# Patient Record
Sex: Male | Born: 1960 | Race: White | Hispanic: No | Marital: Single | Smoking: Former smoker
Health system: Southern US, Community
[De-identification: ages and names within clinical notes are randomized; demographics above are authoritative.]

## PROBLEM LIST (undated history)

## (undated) DIAGNOSIS — K5792 Diverticulitis of intestine, part unspecified, without perforation or abscess without bleeding: Secondary | ICD-10-CM

## (undated) DIAGNOSIS — E7800 Pure hypercholesterolemia, unspecified: Secondary | ICD-10-CM

## (undated) DIAGNOSIS — I10 Essential (primary) hypertension: Secondary | ICD-10-CM

## (undated) DIAGNOSIS — G629 Polyneuropathy, unspecified: Secondary | ICD-10-CM

## (undated) DIAGNOSIS — M1990 Unspecified osteoarthritis, unspecified site: Secondary | ICD-10-CM

## (undated) HISTORY — DX: Essential (primary) hypertension: I10

## (undated) HISTORY — DX: Pure hypercholesterolemia, unspecified: E78.00

## (undated) HISTORY — DX: Polyneuropathy, unspecified: G62.9

## (undated) HISTORY — PX: NASAL SINUS SURGERY: SHX719

## (undated) HISTORY — PX: GALLBLADDER SURGERY: SHX652

## (undated) HISTORY — PX: TONSILLECTOMY AND ADENOIDECTOMY: SHX28

## (undated) HISTORY — DX: Diverticulitis of intestine, part unspecified, without perforation or abscess without bleeding: K57.92

## (undated) HISTORY — PX: INGUINAL HERNIA REPAIR: SHX194

---

## 1998-12-22 ENCOUNTER — Encounter: Admission: RE | Admit: 1998-12-22 | Discharge: 1999-03-22 | Payer: Self-pay | Admitting: Family Medicine

## 2010-04-27 ENCOUNTER — Telehealth: Payer: Self-pay | Admitting: Gastroenterology

## 2011-04-04 NOTE — Telephone Encounter (Signed)
Summary: Schedule Colonoscopy  Phone Note Outgoing Call Call back at Home Phone [phone]   Call placed by: Harlow Mares CMA Duncan Dull),  April 27, 2010 4:23 PM Call placed to: Patient Summary of Call: patients number will not allow me to leave a message but i will try to call back at a later date. He is due for a colonoscopy.  Initial call taken by: Harlow Mares CMA Duncan Dull),  April 27, 2010 4:26 PM  Follow-up for Phone Call        patinet number will not allow me to leave a message...we will mail him a letter to let him know he is due for a colonoscopy.  Follow-up by: Harlow Mares CMA Duncan Dull),  May 06, 2010 10:41 AM

## 2012-02-22 ENCOUNTER — Encounter: Payer: Self-pay | Admitting: Gastroenterology

## 2014-07-07 ENCOUNTER — Encounter: Payer: Self-pay | Admitting: Neurology

## 2014-07-07 ENCOUNTER — Encounter (INDEPENDENT_AMBULATORY_CARE_PROVIDER_SITE_OTHER): Payer: Self-pay

## 2014-07-07 ENCOUNTER — Ambulatory Visit (INDEPENDENT_AMBULATORY_CARE_PROVIDER_SITE_OTHER): Payer: BC Managed Care – HMO | Admitting: Neurology

## 2014-07-07 ENCOUNTER — Encounter (INDEPENDENT_AMBULATORY_CARE_PROVIDER_SITE_OTHER): Payer: BC Managed Care – HMO

## 2014-07-07 VITALS — BP 145/81 | HR 60 | Ht 72.0 in | Wt 240.0 lb

## 2014-07-07 DIAGNOSIS — E7800 Pure hypercholesterolemia, unspecified: Secondary | ICD-10-CM | POA: Insufficient documentation

## 2014-07-07 DIAGNOSIS — R209 Unspecified disturbances of skin sensation: Secondary | ICD-10-CM

## 2014-07-07 DIAGNOSIS — R202 Paresthesia of skin: Secondary | ICD-10-CM

## 2014-07-07 DIAGNOSIS — G629 Polyneuropathy, unspecified: Secondary | ICD-10-CM

## 2014-07-07 DIAGNOSIS — G589 Mononeuropathy, unspecified: Secondary | ICD-10-CM

## 2014-07-07 DIAGNOSIS — Z0289 Encounter for other administrative examinations: Secondary | ICD-10-CM

## 2014-07-07 NOTE — Progress Notes (Signed)
See office visit Sep 28th 2015

## 2014-07-07 NOTE — Procedures (Signed)
   NCS (NERVE CONDUCTION STUDY) WITH EMG (ELECTROMYOGRAPHY) REPORT   STUDY DATE: September 28th 2015 PATIENT NAME: Jack Sims DOB: 16-Jun-1961 MRN: [id number]    TECHNOLOGIST: Gearldine Shown ELECTROMYOGRAPHER: Levert Feinstein M.D.  CLINICAL INFORMATION:  53 years old gentleman, presenting with bilateral hands, and feet paresthesia for few months, he had a past medical history of glucose intolerance, chronic low back pain.  FINDINGS: NERVE CONDUCTION STUDY: Bilateral peroneal sensory responses were absent. Bilateral peroneal, tibial motor responses showed mildly to moderately decreased C. map amplitude, was normal conduction velocity, distal latency. Bilateral tibial H. reflexes were absent.  Bilateral median, ulnar sensory and motor responses were normal.  NEEDLE ELECTROMYOGRAPHY:  Selected needle examination was performed at bilateral lower extremity muscles, right lumbosacral paraspinal muscles.  Needle examination of bilateral tibialis anterior, tibialis posterior, peroneal longus, medial gastrocnemius, vastus lateralis was normal.  Needle examination of left abductor pollicis longus was normal.  There was no spontaneous activity at right lumbosacral paraspinal muscles, right L4-5 S1.  IMPRESSION:   This is an abnormal study. There is electrodiagnostic evidence of length dependent mild axonal peripheral neuropathy. There was no evidence of right lumbosacral radiculopathy.   INTERPRETING PHYSICIAN:   Levert Feinstein M.D. Ph.D. [facility] [address], [address] [place], [place] [address] [phone]

## 2014-07-07 NOTE — Progress Notes (Signed)
PATIENT: Jack Sims DOB: 1961-07-05  HISTORICAL  Jack Sims is a 53 years old right-handed male, referred by his primary care physician Dr. Suzy Bouchard for evaluation of bilateral hands, and feet paresthesia next  He had a past meniscal history of hyperlipidemia, glucose intolerance, chronic low back pain  Since July 2015, he began to notice bilateral hands intermittent itching, involving different spots, sometimes to the point that"I want to claw my hand off", a few weeks ago, he also developed similar sensation involving both feet, bottom, and top of his feet, intermittent, involving different spots, intense itching sensation.  He did not notice any allergy treaters, such as change of the lotion, detergents,  He denies significant weakness, no gait difficulty, chronic intermittent low back pain, no incontinence, mild neck pain, no shooting pain.  Laboratory showed glucose 133, nonfasting, normal TSH, otherwise normal CBC CMP.  REVIEW OF SYSTEMS: Full 14 system review of systems performed and notable only for snoring ALLERGIES: Allergies  Allergen Reactions  . Sudafed [Pseudoephedrine Hcl]     HOME MEDICATIONS: Current Outpatient Prescriptions on File Prior to Visit  Medication Sig Dispense Refill  . aspirin 81 MG tablet Take 81 mg by mouth daily.       No current facility-administered medications on file prior to visit.    PAST MEDICAL HISTORY: Past Medical History  Diagnosis Date  . Neuropathy   . High cholesterol     PAST SURGICAL HISTORY: Past Surgical History  Procedure Laterality Date  . Tonsillectomy and adenoidectomy    . Gallbladder surgery    . Nasal sinus surgery    . Hernia repair      FAMILY HISTORY: Family History  Problem Relation Age of Onset  . Cancer Mother   . Heart Problems Mother     SOCIAL HISTORY:  History   Social History  . Marital Status: Unknown    Spouse Name: N/A    Number of Children: 1  . Years of Education: 13    Occupational History  .      [employer]   Social History Main Topics  . Smoking status: Current Every Day Smoker  . Smokeless tobacco: Never Used  . Alcohol Use: 0.6 oz/week    1 Cans of beer per week     Comment: Rare  . Drug Use: No  . Sexual Activity: Not on file   Other Topics Concern  . Not on file   Social History Narrative   Patient Lives at home with his girlfriend. Patient works full time for [employer].    Education some college.   Right handed.   Caffeine 3-4 Pots of coffee daily.     PHYSICAL EXAM   There were no vitals filed for this visit.  Not recorded    There is no height or weight on file to calculate BMI.   Generalized: In no acute distress  Neck: Supple, no carotid bruits   Cardiac: Regular rate rhythm  Pulmonary: Clear to auscultation bilaterally  Musculoskeletal: No deformity  Neurological examination  Mentation: Alert oriented to time, place, history taking, and causual conversation  Cranial nerve II-XII: Pupils were equal round reactive to light. Extraocular movements were full.  Visual field were full on confrontational test. Bilateral fundi were sharp.  Facial sensation and strength were normal. Hearing was intact to finger rubbing bilaterally. Uvula tongue midline.  Head turning and shoulder shrug and were normal and symmetric.Tongue protrusion into cheek strength was normal.  Motor:  Normal tone, bulk and strength.  Sensory: Length dependent decreased fine touch, pinprick, ankle level, absent vibratory sensation at toes, and proprioception at toes.  Coordination: Normal finger to nose, heel-to-shin bilaterally there was no truncal ataxia  Gait: Rising up from seated position without assistance, normal stance, without trunk ataxia, moderate stride, good arm swing, smooth turning, able to perform tiptoe, and heel walking without difficulty.   Romberg signs: Negative  Deep tendon reflexes: Brachioradialis 2/2, biceps 2/2, triceps 2/2,  patellar 2/2, Achilles 2/2, plantar responses were flexor bilaterally.   DIAGNOSTIC DATA (LABS, IMAGING, TESTING) - I reviewed patient records, labs, notes, testing and imaging myself where available.   ASSESSMENT AND PLAN  Jack Sims is a 53 y.o. male complains of bilateral hands, and feet paresthesia, history of glucose intolerance, mild length dependent sensory changes.  1. He does have evidence of peripheral neuropathy, most likely due to his glucose intolerance, EMG nerve conduction study 2. However his current complains of intermittent, bilateral hand and feet intense itching, suggestive of other potential etiology, such as  allergy, dermatology pathology. 3. I have suggested Benadryl as needed giving him prescription of gabapentin as needed 4. Return to clinic in 3-4 weeks, if he continued to be symptomatic, may consider further laboratory evaluations,    Levert Feinstein, M.D. Ph.D.  [facility] [address] [place], [place] [address] [phone]

## 2014-08-04 ENCOUNTER — Ambulatory Visit (INDEPENDENT_AMBULATORY_CARE_PROVIDER_SITE_OTHER): Payer: BC Managed Care – HMO | Admitting: Neurology

## 2014-08-04 ENCOUNTER — Encounter: Payer: Self-pay | Admitting: Neurology

## 2014-08-04 VITALS — BP 149/87 | HR 54 | Ht 72.0 in | Wt 240.0 lb

## 2014-08-04 DIAGNOSIS — E7800 Pure hypercholesterolemia, unspecified: Secondary | ICD-10-CM

## 2014-08-04 DIAGNOSIS — R202 Paresthesia of skin: Secondary | ICD-10-CM

## 2014-08-04 DIAGNOSIS — G629 Polyneuropathy, unspecified: Secondary | ICD-10-CM

## 2014-08-04 NOTE — Progress Notes (Signed)
See office visit Sep 28th 2015    PATIENT: Jack LeashMarc S Sims DOB: 12/10/1960  HISTORICAL  Jack Sims is a 53 years old right-handed male, referred by his primary care physician Dr. Suzy BouchardJames Milam for evaluation of bilateral hands, and feet paresthesia next  He had a past meniscal history of hyperlipidemia, glucose intolerance, chronic low back pain  Since July 2015, he began to notice bilateral hands intermittent itching, involving different spots, sometimes to the point that"I want to claw my hand off", a few weeks ago, he also developed similar sensation involving both feet, bottom, and top of his feet, intermittent, involving different spots, intense itching sensation.  He did not notice any allergy treaters, such as change of the lotion, detergents,  He denies significant weakness, no gait difficulty, chronic intermittent low back pain, no incontinence, mild neck pain, no shooting pain.  Laboratory showed glucose 133, nonfasting, normal TSH, otherwise normal CBC CMP.  UPDATE Oct 26th 2015:  He has chronic low back pain, he continues to complain of bilateral hand and feet paresthesia, but has improved with daily claritin and prn benadryl. No gait difficulty   REVIEW OF SYSTEMS: Full 14 system review of systems performed and notable only for snoring ALLERGIES: Allergies  Allergen Reactions  . Sudafed [Pseudoephedrine Hcl]     HOME MEDICATIONS: Current Outpatient Prescriptions on File Prior to Visit  Medication Sig Dispense Refill  . aspirin 81 MG tablet Take 81 mg by mouth daily.       No current facility-administered medications on file prior to visit.    PAST MEDICAL HISTORY: Past Medical History  Diagnosis Date  . Neuropathy   . High cholesterol     PAST SURGICAL HISTORY: Past Surgical History  Procedure Laterality Date  . Tonsillectomy and adenoidectomy    . Gallbladder surgery    . Nasal sinus surgery    . Hernia repair      FAMILY HISTORY: Family History    Problem Relation Age of Onset  . Cancer Mother   . Heart Problems Mother     SOCIAL HISTORY:  History   Social History  . Marital Status: Unknown    Spouse Name: N/A    Number of Children: 1  . Years of Education: 13   Occupational History  .      [employer]   Social History Main Topics  . Smoking status: Current Every Day Smoker  . Smokeless tobacco: Never Used  . Alcohol Use: 0.6 oz/week    1 Cans of beer per week     Comment: Rare  . Drug Use: No  . Sexual Activity: Not on file   Other Topics Concern  . Not on file   Social History Narrative   Patient Lives at home with his girlfriend. Patient works full time for [employer]    Education some college.   Right handed.   Caffeine 3-4 Pots of coffee daily.     PHYSICAL EXAM   Filed Vitals:   08/04/14 0816  BP: 149/87  Pulse: 54  Height: 6' (1.829 m)  Weight: 240 lb (108.863 kg)    Not recorded    Body mass index is 32.54 kg/(m^2).   Generalized: In no acute distress  Neck: Supple, no carotid bruits   Cardiac: Regular rate rhythm  Pulmonary: Clear to auscultation bilaterally  Musculoskeletal: No deformity  Neurological examination  Mentation: Alert oriented to time, place, history taking, and causual conversation  Cranial nerve II-XII: Pupils were equal round  reactive to light. Extraocular movements were full.  Visual field were full on confrontational test. Bilateral fundi were sharp.  Facial sensation and strength were normal. Hearing was intact to finger rubbing bilaterally. Uvula tongue midline.  Head turning and shoulder shrug and were normal and symmetric.Tongue protrusion into cheek strength was normal.  Motor: Normal tone, bulk and strength.  Sensory: Length dependent decreased fine touch, pinprick, ankle level, absent vibratory sensation at toes, and proprioception at toes.  Coordination: Normal finger to nose, heel-to-shin bilaterally there was no truncal ataxia  Gait: Rising up from  seated position without assistance, normal stance, without trunk ataxia, moderate stride, good arm swing, smooth turning, able to perform tiptoe, and heel walking without difficulty.   Romberg signs: Negative  Deep tendon reflexes: Brachioradialis 2/2, biceps 2/2, triceps 2/2, patellar 2/2, Achilles 1/1,  plantar responses were flexor bilaterally.   DIAGNOSTIC DATA (LABS, IMAGING, TESTING) - I reviewed patient records, labs, notes, testing and imaging myself where available.   ASSESSMENT AND PLAN  Jack Sims is a 53 y.o. male complains of bilateral hands, and feet paresthesia, history of glucose intolerance, mild length dependent sensory changes.  1. Mild axonal peripheral neuropathy, most likely due to his glucose intolerance,  2. However his current complains of intermittent, bilateral hand and feet intense itching, suggestive of other potential etiology, such as dermatological etiology. 3. He does not want further evaluation at this point,  4. RTC prn  Levert FeinsteinYijun Wahid Holley, M.D. Ph.D.  [facility] [address], [address] [place], [place] [id number] [phone]

## 2014-09-09 ENCOUNTER — Telehealth: Payer: Self-pay | Admitting: Gastroenterology

## 2014-09-09 NOTE — Telephone Encounter (Signed)
Colonoscopy done 2005-records in chart Spoke with the patient. Reports his Thanksgiving was the worst due to the discomfort he feels. He was also being referred by his primary for rectal bleeding. Appointment scheduled.

## 2014-09-10 ENCOUNTER — Ambulatory Visit (INDEPENDENT_AMBULATORY_CARE_PROVIDER_SITE_OTHER): Payer: BC Managed Care – PPO | Admitting: Gastroenterology

## 2014-09-10 ENCOUNTER — Encounter: Payer: Self-pay | Admitting: Gastroenterology

## 2014-09-10 VITALS — BP 142/72 | HR 60 | Ht 71.5 in | Wt 244.0 lb

## 2014-09-10 DIAGNOSIS — K5732 Diverticulitis of large intestine without perforation or abscess without bleeding: Secondary | ICD-10-CM

## 2014-09-10 DIAGNOSIS — R1032 Left lower quadrant pain: Secondary | ICD-10-CM

## 2014-09-10 MED ORDER — METRONIDAZOLE 500 MG PO TABS: 500.0000 mg | ORAL_TABLET | Freq: Three times a day (TID) | ORAL | Status: DC

## 2014-09-10 MED ORDER — CIPROFLOXACIN HCL 500 MG PO TABS: 500.0000 mg | ORAL_TABLET | Freq: Two times a day (BID) | ORAL | Status: DC

## 2014-09-10 NOTE — Progress Notes (Signed)
09/10/2014 Jack Sims [id number] 09/30/1961   HISTORY OF PRESENT ILLNESS:  This is a 53 year old male who is known to Dr. Arlyce DiceKaplan previously in February 2005 at which time he had a colonoscopy performed that showed hyperplastic colon polyps and diverticulosis. Since then he has been following with [place]. Aleene [place]hour in [place].  He is returning here for care after Dr. Vangie BickerSpainhour's retirement.  We received some records from their office.  His last colonoscopy was 11/2009 at which time he was found to have diverticulosis and internal hemorrhoids.  He is here today with complaints of LLQ abdominal pain that he is convinced is from diverticulitis.  He says that he's had issues with diverticulitis for over 10 years and has been treated multiple times with antibiotics.  No radiographic imaging to confirm episodes, however.  He says that the since June this is his third episode of pain.  Was treated in the summer and then again in November and now pain has returned.  It is in LLQ and makes his whole left side hurt.  No nausea, vomiting, fevers, chills.  No constipation.  Had some occasional red blood on the toilet paper since these "flares" of diverticulitis began this past summer.  He is adamant that he would like to discuss surgery since he has been dealing with this for so long and just wants it fixed.   Past Medical History  Diagnosis Date  . Neuropathy   . High cholesterol   . Hypertension    Past Surgical History  Procedure Laterality Date  . Tonsillectomy and adenoidectomy    . Gallbladder surgery    . Nasal sinus surgery    . Hernia repair      reports that he has been smoking Cigarettes.  He has a 35 pack-year smoking history. He has never used smokeless tobacco. He reports that he drinks about 0.6 oz of alcohol per week. He reports that he does not use illicit drugs. family history includes Diabetes in his maternal grandmother and mother; Heart Problems in his mother; Pancreatic  cancer in his mother. There is no history of Colon cancer, Colon polyps, Kidney disease, or Esophageal cancer. Allergies  Allergen Reactions  . Sudafed [Pseudoephedrine Hcl]       Outpatient Encounter Prescriptions as of 09/10/2014  Medication Sig  . aspirin 81 MG tablet Take 81 mg by mouth daily.  Marland Kitchen. loratadine (CLARITIN) 10 MG tablet Take 10 mg by mouth daily.  . ciprofloxacin (CIPRO) 500 MG tablet Take 1 tablet (500 mg total) by mouth 2 (two) times daily.  . metroNIDAZOLE (FLAGYL) 500 MG tablet Take 1 tablet (500 mg total) by mouth 3 (three) times daily.     REVIEW OF SYSTEMS  : All other systems reviewed and negative except where noted in the History of Present Illness.   PHYSICAL EXAM: BP 142/72 mmHg  Pulse 60  Ht 5' 11.5" (1.816 m)  Wt 244 lb (110.678 kg)  BMI 33.56 kg/m2 General: Well developed white male in no acute distress Head: Normocephalic and atraumatic Eyes:  Sclerae anicteric, conjunctiva pink. Ears: Normal auditory acuity Lungs: Clear throughout to auscultation Heart: Regular rate and rhythm Abdomen: Soft, non-distended.  Normal bowel sounds.  LLQ TTP without R/R/G. Musculoskeletal: Symmetrical with no gross deformities  Skin: No lesions on visible extremities Extremities: No edema  Neurological: Alert oriented x 4, grossly non-focal Psychological:  Alert and cooperative. Normal mood and affect  ASSESSMENT AND PLAN: -53 year old male with recurrent episodes  of LLQ abdominal pain and history of diverticulosis.  Treated recurrently for diverticulitis over the years, but we do not have any imaging to document these recurrent episodes.  He is adamant that he wants to discuss surgery because he is "tired of dealing with this" after 10+ years.  I have suggested that we obtain a CT scan to assess and radiographically document diverticulitis and also rule out other issues.  Will treat with cipro 500 mg BID and flagyl 500 mg TID for 10 days.  Will refer to [facility] for  consultation regarding possible segmental/sigmoid resection.

## 2014-09-10 NOTE — Patient Instructions (Addendum)
We have sent the following medications to your pharmacy for you to pick up at your convenience: Cipro  Flagyl  You have been scheduled for an appointment with Dr. Johney Maine at [facility]. Your appointment is on 09-23-2014 at 10:15 am. Please arrive at 9:45 am for registration. Make certain to bring a list of current medications, including any over the counter medications or vitamins. Also bring your co-pay if you have one as well as your insurance cards. [facility] is located at [address]. Should you need to reschedule your appointment, please contact them at [phone]. _______________________________________________________________________________________________________________________________________________________________________________________________ You have been scheduled for a CT scan of the abdomen and pelvis at [facility] ([address]---this is in the same building as Press [facility]).   You are scheduled on 09-15-2014 at 11:30 AM. You should arrive 15 minutes prior to your appointment time for registration. Please follow the written instructions below on the day of your exam:  WARNING: IF YOU ARE ALLERGIC TO IODINE/X-RAY DYE, PLEASE NOTIFY RADIOLOGY IMMEDIATELY AT [phone]! YOU WILL BE GIVEN A 13 HOUR PREMEDICATION PREP.  1) Do not eat or drink anything after 7:30 am (4 hours prior to your test) 2) You have been given 2 bottles of oral contrast to drink. The solution may taste better if refrigerated, but do NOT add ice or any other liquid to this solution. Shake well before drinking.   Drink 1 bottle of contrast @ 9:30 am (2 hours prior to your exam) Drink 1 bottle of contrast @ 10:30 am (1 hour prior to your exam)  You may take any medications as prescribed with a small amount of water except for the following: Metformin, Glucophage, Glucovance, Avandamet, Riomet, Fortamet, Actoplus Met, Janumet, Glumetza or  Metaglip. The above medications must be held the day of the exam AND 48 hours after the exam.  The purpose of you drinking the oral contrast is to aid in the visualization of your intestinal tract. The contrast solution may cause some diarrhea. Before your exam is started, you will be given a small amount of fluid to drink. Depending on your individual set of symptoms, you may also receive an intravenous injection of x-ray contrast/dye. Plan on being at [facility] for 30 minutes or long, depending on the type of exam you are having performed.  This test typically takes 30-45 minutes to complete.  If you have any questions regarding your exam or if you need to reschedule, you may call the CT department at [phone] between the hours of 8:00 am and 5:00 pm, Monday-Friday.  ________________________________________________________________________

## 2014-09-11 NOTE — Progress Notes (Signed)
Reviewed and agree with management. Yesly Gerety D. Neysha Criado, M.D., FACG  

## 2014-09-15 ENCOUNTER — Ambulatory Visit (INDEPENDENT_AMBULATORY_CARE_PROVIDER_SITE_OTHER)
Admission: RE | Admit: 2014-09-15 | Discharge: 2014-09-15 | Disposition: A | Discharge status: Home / Self Care | Payer: BC Managed Care – PPO | Source: Ambulatory Visit | Attending: Gastroenterology | Admitting: Gastroenterology

## 2014-09-15 DIAGNOSIS — K5732 Diverticulitis of large intestine without perforation or abscess without bleeding: Secondary | ICD-10-CM

## 2014-09-15 DIAGNOSIS — R1032 Left lower quadrant pain: Secondary | ICD-10-CM

## 2014-09-15 MED ORDER — IOHEXOL 300 MG/ML  SOLN
100.0000 mL | Freq: Once | INTRAMUSCULAR | Status: AC | PRN
  Administered 2014-09-15: 100 mL via INTRAVENOUS

## 2014-09-23 ENCOUNTER — Other Ambulatory Visit (INDEPENDENT_AMBULATORY_CARE_PROVIDER_SITE_OTHER): Payer: Self-pay | Admitting: Surgery

## 2014-09-23 NOTE — H&P (Signed)
Lupita Leash 09/23/2014 10:03 AM Location: [facility] Patient #: [id number] DOB: 1961-01-17 Divorced / Language: Shon Hough / Race: Undefined Male History of Present Illness [name] MD; 09/23/2014 12:47 PM) Patient words: recurrent diverticulitis.  The patient is a 53 year old male who presents with diverticulitis. Patient sent to me by gastroenterology for concern of recurrent diverticulitis. Followed by [name] and Dr. [name] Patient claims to have had episodes of left flank and lower quadrant pain for the past 15 years. Told it was diverticulitis about 10 years ago. He's had repeated attacks. Usually switches to a liquid diet and takes laxatives to help ride it out. He's needed antibiotics at least 3 times. He's had more frequent and intense attacks this year. 3 attacks in the last 6 months. His gastroenterologist in [place], [place] retired. Switched to gastroenterology in [place]. Had a more intense attack. Started on antibiotics. CT scan showed some thickening of the sigmoid colon. A little atypical. Patient due to get flexible sigmoidoscopy to rule out mass or tumor. Had a normal colonoscopy in 2011 by his gastroenterologist up in [place] before that gastroenterologist retired. Patient had open right inguinal hernia repair and cholecystectomy done laparoscopically up in [place]. Patient normally has 1-2 bowel movements a day. He does get some cramping and fullness in his left upper abdomen. He feels like he gets backed up and constipated. He does smoke a pack a day. Denies any major infections. He can walk a few miles without much difficulty his lungs he goes at a slow and even pace. He is frustrated and wishes to have something more aggressive done to stop these episodes of diverticulitis. Wished to see a Careers adviser. This was set up by his gastroenterologist. He has mild chronic left flank pain now. Off antibiotics. He has noted some  blood in his stool since June. Other Problems Kerrie Buffalo, CMA; 09/23/2014 10:03 AM) Arthritis Back Pain Cholelithiasis Diverticulosis Hemorrhoids High blood pressure Hypercholesterolemia Inguinal Hernia  Past Surgical History Kerrie Buffalo, CMA; 09/23/2014 10:03 AM) Colon Polyp Removal - Colonoscopy Gallbladder Surgery - Laparoscopic Open Inguinal Hernia Surgery Right. Tonsillectomy  Diagnostic Studies History Kerrie Buffalo, CMA; 09/23/2014 10:03 AM) Colonoscopy 1-5 years ago  Allergies Kerrie Buffalo, CMA; 09/23/2014 10:04 AM) Sudafed *NASAL AGENTS - SYSTEMIC AND TOPICAL*  Medication History Kerrie Buffalo, CMA; 09/23/2014 10:05 AM) Ciprofloxacin HCl (500MG  Tablet, Oral) Active. Enalapril-Hydrochlorothiazide (5-12.5MG  Tablet, Oral) Active. MetroNIDAZOLE (250MG  Tablet, Oral) Active.  Social History Kerrie Buffalo, CMA; 09/23/2014 10:03 AM) Alcohol use Occasional alcohol use. Caffeine use Coffee, Tea. No drug use Tobacco use Current every day smoker.  Family History Kerrie Buffalo, CMA; 09/23/2014 10:03 AM) Alcohol Abuse Brother, Father. Colon Polyps Father. Diabetes Mellitus Mother. Heart Disease Mother. Heart disease in male family member before age 67 Malignant Neoplasm Of Pancreas Mother. Melanoma Father. Migraine Headache Mother.     Review of Systems Kerrie Buffalo CMA; 09/23/2014 10:03 AM) General Not Present- Appetite Loss, Chills, Fatigue, Fever, Night Sweats, Weight Gain and Weight Loss. Skin Not Present- Change in Wart/Mole, Dryness, Hives, Jaundice, New Lesions, Non-Healing Wounds, Rash and Ulcer. Respiratory Present- Snoring. Not Present- Bloody sputum, Chronic Cough, Difficulty Breathing and Wheezing. Breast Not Present- Breast Mass, Breast Pain, Nipple Discharge and Skin Changes. Cardiovascular Present- Chest Pain. Not Present- Difficulty Breathing Lying Down, Leg Cramps, Palpitations, Rapid Heart  Rate, Shortness of Breath and Swelling of Extremities. Gastrointestinal Present- Abdominal Pain, Bloating, Bloody Stool, Excessive gas and Gets full quickly at meals. Not Present- Change in Bowel Habits,  Chronic diarrhea, Constipation, Difficulty Swallowing, Hemorrhoids, Indigestion, Nausea, Rectal Pain and Vomiting. Male Genitourinary Not Present- Blood in Urine, Change in Urinary Stream, Frequency, Impotence, Nocturia, Painful Urination, Urgency and Urine Leakage.  Vitals Kerrie Buffalo CMA; 09/23/2014 10:05 AM) 09/23/2014 10:05 AM Weight: 240 lb Height: 72in Body Surface Area: 2.35 m Body Mass Index: 32.55 kg/m Temp.: 63F  Pulse: 79 (Regular)  BP: 140/80 (Sitting, Left Arm, Standard)     Physical Exam [name] MD; 09/23/2014 12:47 PM)  General Mental Status-Alert. General Appearance-Not in acute distress, Not Sickly. Orientation-Oriented X3. Hydration-Well hydrated. Voice-Normal.  Integumentary Global Assessment Upon inspection and palpation of skin surfaces of the - Axillae: non-tender, no inflammation or ulceration, no drainage. and Distribution of scalp and body hair is normal. General Characteristics Temperature - normal warmth is noted.  Head and Neck Head-normocephalic, atraumatic with no lesions or palpable masses. Face Global Assessment - atraumatic, no absence of expression. Neck Global Assessment - no abnormal movements, no bruit auscultated on the right, no bruit auscultated on the left, no decreased range of motion, non-tender. Trachea-midline. Thyroid Gland Characteristics - non-tender.  Eye Eyeball - Left-Extraocular movements intact, No Nystagmus. Eyeball - Right-Extraocular movements intact, No Nystagmus. Cornea - Left-No Hazy. Cornea - Right-No Hazy. Sclera/Conjunctiva - Left-No scleral icterus, No Discharge. Sclera/Conjunctiva - Right-No scleral icterus, No Discharge. Pupil - Left-Direct reaction  to light normal. Pupil - Right-Direct reaction to light normal.  ENMT Ears Pinna - Left - no drainage observed, no generalized tenderness observed. Right - no drainage observed, no generalized tenderness observed. Nose and Sinuses External Inspection of the Nose - no destructive lesion observed. Inspection of the nares - Left - quiet respiration. Right - quiet respiration. Mouth and Throat Lips - Upper Lip - no fissures observed, no pallor noted. Lower Lip - no fissures observed, no pallor noted. Nasopharynx - no discharge present. Oral Cavity/Oropharynx - Tongue - no dryness observed. Oral Mucosa - no cyanosis observed. Hypopharynx - no evidence of airway distress observed.  Chest and Lung Exam Inspection Movements - Normal and Symmetrical. Accessory muscles - No use of accessory muscles in breathing. Palpation Palpation of the chest reveals - Non-tender. Auscultation Breath sounds - Normal and Clear.  Cardiovascular Auscultation Rhythm - Regular. Murmurs & Other Heart Sounds - Auscultation of the heart reveals - No Murmurs and No Systolic Clicks.  Abdomen Inspection Inspection of the abdomen reveals - No Visible peristalsis and No Abnormal pulsations. Umbilicus - No Bleeding, No Urine drainage. Palpation/Percussion Palpation and Percussion of the abdomen reveal - Soft, Non Tender, No Rebound tenderness, No Rigidity (guarding) and No Cutaneous hyperesthesia. Note: Mild left flank and lower quadrant tenderness to palpation. Mild left upper quadrant pain. No peritonitis or guarding. Obese but soft.   Male Genitourinary Sexual Maturity Tanner 5 - Adult hair pattern and Adult penile size and shape.  Peripheral Vascular Upper Extremity Inspection - Left - No Cyanotic nailbeds, Not Ischemic. Right - No Cyanotic nailbeds, Not Ischemic.  Neurologic Neurologic evaluation reveals -normal attention span and ability to concentrate, able to name objects and repeat phrases.  Appropriate fund of knowledge , normal sensation and normal coordination. Mental Status Affect - not angry, not paranoid. Cranial Nerves-Normal Bilaterally. Gait-Normal.  Neuropsychiatric Mental status exam performed with findings of-able to articulate well with normal speech/language, rate, volume and coherence, thought content normal with ability to perform basic computations and apply abstract reasoning and no evidence of hallucinations, delusions, obsessions or homicidal/suicidal ideation.  Musculoskeletal Global Assessment Spine, Ribs and  Pelvis - no instability, subluxation or laxity. Right Upper Extremity - no instability, subluxation or laxity.  Lymphatic Head & Neck  General Head & Neck Lymphatics: Bilateral - Description - No Localized lymphadenopathy. Axillary  General Axillary Region: Bilateral - Description - No Localized lymphadenopathy. Femoral & Inguinal  Generalized Femoral & Inguinal Lymphatics: Left - Description - No Localized lymphadenopathy. Right - Description - No Localized lymphadenopathy.    Assessment & Plan [name](Jeaninne Lodico C. 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 Attacks for the past 15 years. Increasing in intensity and frequency.  Given the eccentric thickening and hematochezia for the past few months, I strongly agree with Dr. Marzetta BoardKaplan's suggestion of flexible sigmoidoscopy to rule out cancer or tumor. Hopefully not likely given normal colonoscopy 4 years before.  I think he would benefit from sigmoid resection of the chronic diverticulitis/stricture. Reasonable minimally invasive approach. Robotic if available versus laparoscopic. Reasonable operative risk.  His biggest risk is his smoking. I tried to strongly suggest that he quit.  He is very interested in surgery as a more definitive solution to the problem. I asked that he call me once the  flexible sigmoidoscopy is done so we can have a plan.  Current Plans Schedule for Surgery Instructions:  STOP SMOKING!  We strongly recommend that you stop smoking. Smoking increases the risk of surgery including infection in the form of an open wound, pus formation, abscess, hernia at an incision on the abdomen, etc. You have an increased risk of other MAJOR complications such as stroke, heart attack, forming clots in the leg and/or lungs, and death.  Smoking Cessation Quitting smoking is important to your health and has many advantages. However, it is not always easy to quit since nicotine is a very addictive drug. Often times, people try 3 times or more before being able to quit. This document explains the best ways for you to prepare to quit smoking. Quitting takes hard work and a lot of effort, but you can do it. ADVANTAGES OF QUITTING SMOKING  You will live longer, feel better, and live better.  Your body will feel the impact of quitting smoking almost immediately.  Within 20 minutes, blood pressure decreases. Your pulse returns to its normal level.  After 8 hours, carbon monoxide levels in the blood return to normal. Your oxygen level increases.  After 24 hours, the chance of having a heart attack starts to decrease. Your breath, hair, and body stop smelling like smoke.  After 48 hours, damaged nerve endings begin to recover. Your sense of taste and smell improve.  After 72 hours, the body is virtually free of nicotine. Your bronchial tubes relax and breathing becomes easier.  After 2 to 12 weeks, lungs can hold more air. Exercise becomes easier and circulation improves.  The risk of having a heart attack, stroke, cancer, or lung disease is greatly reduced.  After 1 year, the risk of coronary heart disease is cut in half.  After 5 years, the risk of stroke falls to the same as a nonsmoker.  After 10 years, the risk of lung cancer is cut in half and the risk of other cancers  decreases significantly.  After 15 years, the risk of coronary heart disease drops, usually to the level of a nonsmoker.  If you are pregnant, quitting smoking will improve your chances of having a healthy baby.  The people you live with, especially any children, will  be healthier.  You will have extra money to spend on things other than cigarettes. QUESTIONS TO THINK ABOUT BEFORE ATTEMPTING TO QUIT You may want to talk about your answers with your caregiver.  Why do you want to quit?  If you tried to quit in the past, what helped and what did not?  What will be the most difficult situations for you after you quit? How will you plan to handle them?  Who can help you through the tough times? Your family? Friends? A caregiver?  What pleasures do you get from smoking? What ways can you still get pleasure if you quit? Here are some questions to ask your caregiver:  How can you help me to be successful at quitting?  What medicine do you think would be best for me and how should I take it?  What should I do if I need more help?  What is smoking withdrawal like? How can I get information on withdrawal? GET READY  Set a quit date.  Change your environment by getting rid of all cigarettes, ashtrays, matches, and lighters in your home, car, or work. Do not let people smoke in your home.  Review your past attempts to quit. Think about what worked and what did not. GET SUPPORT AND ENCOURAGEMENT You have a better chance of being successful if you have help. You can get support in many ways.  Tell your family, friends, and co-workers that you are going to quit and need their support. Ask them not to smoke around you.  Get individual, group, or telephone counseling and support. Programs are available at [employer] and health centers. Call your local health department for information about programs in your area.  Spiritual beliefs and practices may help some smokers quit.  Download  a "quit meter" on your computer to keep track of quit statistics, such as how long you have gone without smoking, cigarettes not smoked, and money saved.  Get a self-help book about quitting smoking and staying off of tobacco. LEARN NEW SKILLS AND BEHAVIORS  Distract yourself from urges to smoke. Talk to someone, go for a walk, or occupy your time with a task.  Change your normal routine. Take a different route to work. Drink tea instead of coffee. Eat breakfast in a different place.  Reduce your stress. Take a hot bath, exercise, or read a book.  Plan something enjoyable to do every day. Reward yourself for not smoking.  Explore interactive web-based programs that specialize in helping you quit. GET MEDICINE AND USE IT CORRECTLY Medicines can help you stop smoking and decrease the urge to smoke. Combining medicine with the above behavioral methods and support can greatly increase your chances of successfully quitting smoking.  Nicotine replacement therapy helps deliver nicotine to your body without the negative effects and risks of smoking. Nicotine replacement therapy includes nicotine gum, lozenges, inhalers, nasal sprays, and skin patches. Some may be available over-the-counter and others require a prescription.  Antidepressant medicine helps people abstain from smoking, but how this works is unknown. This medicine is available by prescription.  Nicotinic receptor partial agonist medicine simulates the effect of nicotine in your brain. This medicine is available by prescription. Ask your caregiver for advice about which medicines to use and how to use them based on your health history. Your caregiver will tell you what side effects to look out for if you choose to be on a medicine or therapy. Carefully read the information on the package. Do not  use any other product containing nicotine while using a nicotine replacement product. RELAPSE OR DIFFICULT SITUATIONS Most relapses occur within  the first 3 months after quitting. Do not be discouraged if you start smoking again. Remember, most people try several times before finally quitting. You may have symptoms of withdrawal because your body is used to nicotine. You may crave cigarettes, be irritable, feel very hungry, cough often, get headaches, or have difficulty concentrating. The withdrawal symptoms are only temporary. They are strongest when you first quit, but they will go away within 10 14 days. To reduce the chances of relapse, try to:  Avoid drinking alcohol. Drinking lowers your chances of successfully quitting.  Reduce the amount of caffeine you consume. Once you quit smoking, the amount of caffeine in your body increases and can give you symptoms, such as a rapid heartbeat, sweating, and anxiety.  Avoid smokers because they can make you want to smoke.  Do not let weight gain distract you. Many smokers will gain weight when they quit, usually less than 10 pounds. Eat a healthy diet and stay active. You can always lose the weight gained after you quit.  Find ways to improve your mood other than smoking. FOR MORE INFORMATION www.smokefree.gov   While it can be one of the most difficult things to do, the [place] community has programs to help you stop. Consider talking with your primary care physician about options. Also, Smoking Cessation classes are available through the [facility]:  The smoking cessation program is a proven-effective program from the American Lung Association. The program is available for anyone 5018 and older who currently smokes. The program lasts for 7 weeks and is 8 sessions. Each class will be approximately 1 1/2 hours. The program is every Tuesday. All classes are 12-1:30pm and same location.  Event Location Information: Location: [facility] 2nd Floor Conference Room 2-037; located next to [facility]  Closest cross streets: [address] & [address] [address] Entrance into the  [facility] is adjacent to the [facility]'s main entrance. The conference room is located on the 2nd floor. Parking Instructions: Visitor parking is adjacent to [facility]'s main entrance and the Cancer Center   A smoking cessation program is also offered through the [facility]. Register online at [email or web address] or call [phone] for more information. Tobacco cessation counseling is available at [facility]. Call [phone] for a free appointment. Tobacco cessation classes also are available through the [facility] in [place]. For information, call [phone]. The Patient Education Network features videos on tobacco cessation. Please consult your listings in the center of this book to find instructions on how to access this resource. If you want more information, ask your nurse.     Started Neomycin Sulfate 500MG , 2 (two) Tablet SEE NOTE, #6, 09/23/2014, No Refill. Local Order: TAKE TWO TABLETS AT 2 PM, 3 PM, AND 10 PM THE DAY PRIOR TO SURGERY Started Flagyl 500MG , 2 (two) Tablet SEE NOTE, #6, 09/23/2014, No Refill. Local Order: Take at 2pm, 3pm, and 10pm the day prior to your colon operation [facility] Consent - Colectomy (Delayla Hoffmaster): discussed with patient and provided information.

## 2014-10-29 ENCOUNTER — Ambulatory Visit (AMBULATORY_SURGERY_CENTER): Payer: Self-pay | Admitting: *Deleted

## 2014-10-29 VITALS — Ht 73.0 in | Wt 245.0 lb

## 2014-10-29 DIAGNOSIS — K5732 Diverticulitis of large intestine without perforation or abscess without bleeding: Secondary | ICD-10-CM

## 2014-10-29 NOTE — Progress Notes (Signed)
Patient denies any allergies to eggs or soy. Patient denies any problems with anesthesia/sedation. Patient denies any oxygen use at home and does not take any diet/weight loss medications. EMMI education assisgned to patient on colonoscopy & Sigmoidoscopy, this was explained and instructions given to patient.

## 2014-11-07 ENCOUNTER — Ambulatory Visit (AMBULATORY_SURGERY_CENTER): Payer: BLUE CROSS/BLUE SHIELD | Admitting: Gastroenterology

## 2014-11-07 ENCOUNTER — Encounter: Payer: Self-pay | Admitting: Gastroenterology

## 2014-11-07 VITALS — BP 131/88 | HR 54 | Temp 96.8°F | Resp 10 | Ht 71.5 in | Wt 244.0 lb

## 2014-11-07 DIAGNOSIS — K5732 Diverticulitis of large intestine without perforation or abscess without bleeding: Secondary | ICD-10-CM

## 2014-11-07 DIAGNOSIS — K5730 Diverticulosis of large intestine without perforation or abscess without bleeding: Secondary | ICD-10-CM

## 2014-11-07 MED ORDER — SODIUM CHLORIDE 0.9 % IV SOLN: 500.0000 mL | INTRAVENOUS | Status: DC

## 2014-11-07 NOTE — Progress Notes (Signed)
Report to PACU, RN, vss, BBS= Clear.  

## 2014-11-07 NOTE — Op Note (Signed)
[facility] [address] [place], [id number]   FLEXIBLE SIGMOIDOSCOPY PROCEDURE REPORT  PATIENT: Jack Sims, Jack Sims  MR#: [id number] BIRTHDATE: November 23, 1960 , 53  yrs. old GENDER: male ENDOSCOPIST: Louis Meckelobert D Clista Rainford, MD REFERRED BY: PROCEDURE DATE:  11/07/2014 PROCEDURE:   Sigmoidoscopy, diagnostic ASA CLASS:   Class II INDICATIONS:an abnormal CT. suggesting thickening of the sigmoid colon MEDICATIONS: Monitored anesthesia care and Propofol 200 mg IV  DESCRIPTION OF PROCEDURE:   After the risks benefits and alternatives of the procedure were thoroughly explained, informed consent was obtained.  Digital exam revealed no abnormalities of the rectum. The LB PCF-Q180 [id number]  endoscope was introduced through the anus  and advanced to the descending colon , The exam was Without limitations.    The quality of the prep was The overall prep quality was fair. .  The instrument was then slowly withdrawn as the mucosa was fully examined.         COLON FINDINGS: There was moderate diverticulosis noted in the sigmoid colon with associated inflammatory changes.   The colon mucosa was otherwise normal.    Retroflexed views revealed no abnormalities.    The scope was then withdrawn from the patient and the procedure terminated.  COMPLICATIONS: There were no immediate complications.  ENDOSCOPIC IMPRESSION: 1.   There was moderate diverticulosis noted in the sigmoid colon 2.   The colon mucosa was otherwise normal  RECOMMENDATIONS: surgical resection of the sigmoid for recurrent diverticulitis  REPEAT EXAM:  eSigned:  Louis Meckelobert D Helmer Dull, MD 11/07/2014 9:54 AM   CC: Suzy BouchardJames Milam, MD, Estelle GrumblesSteve Gross, MD

## 2014-11-07 NOTE — Patient Instructions (Signed)
Impressions/recommendations:  Diverticulosis (handout given)  Surgical resection for recurrent diverticulitis  YOU HAD AN ENDOSCOPIC PROCEDURE TODAY AT THE [facility]: Refer to the procedure report that was given to you for any specific questions about what was found during the examination.  If the procedure report does not answer your questions, please call your gastroenterologist to clarify.  If you requested that your care partner not be given the details of your procedure findings, then the procedure report has been included in a sealed envelope for you to review at your convenience later.  YOU SHOULD EXPECT: Some feelings of bloating in the abdomen. Passage of more gas than usual.  Walking can help get rid of the air that was put into your GI tract during the procedure and reduce the bloating. If you had a lower endoscopy (such as a colonoscopy or flexible sigmoidoscopy) you may notice spotting of blood in your stool or on the toilet paper. If you underwent a bowel prep for your procedure, then you may not have a normal bowel movement for a few days.  DIET: Your first meal following the procedure should be a light meal and then it is ok to progress to your normal diet.  A half-sandwich or bowl of soup is an example of a good first meal.  Heavy or fried foods are harder to digest and may make you feel nauseous or bloated.  Likewise meals heavy in dairy and vegetables can cause extra gas to form and this can also increase the bloating.  Drink plenty of fluids but you should avoid alcoholic beverages for 24 hours.  ACTIVITY: Your care partner should take you home directly after the procedure.  You should plan to take it easy, moving slowly for the rest of the day.  You can resume normal activity the day after the procedure however you should NOT DRIVE or use heavy machinery for 24 hours (because of the sedation medicines used during the test).    SYMPTOMS TO REPORT IMMEDIATELY: A  gastroenterologist can be reached at any hour.  During normal business hours, 8:30 AM to 5:00 PM Monday through Friday, call [phone].  After hours and on weekends, please call the GI answering service at [phone] who will take a message and have the physician on call contact you.   Following lower endoscopy (colonoscopy or flexible sigmoidoscopy):  Excessive amounts of blood in the stool  Significant tenderness or worsening of abdominal pains  Swelling of the abdomen that is new, acute  Fever of 100F or higher  FOLLOW UP: If any biopsies were taken you will be contacted by phone or by letter within the next 1-3 weeks.  Call your gastroenterologist if you have not heard about the biopsies in 3 weeks.  Our staff will call the home number listed on your records the next business day following your procedure to check on you and address any questions or concerns that you may have at that time regarding the information given to you following your procedure. This is a courtesy call and so if there is no answer at the home number and we have not heard from you through the emergency physician on call, we will assume that you have returned to your regular daily activities without incident.  SIGNATURES/CONFIDENTIALITY: You and/or your care partner have signed paperwork which will be entered into your electronic medical record.  These signatures attest to the fact that that the information above on your After Visit Summary has been  reviewed and is understood.  Full responsibility of the confidentiality of this discharge information lies with you and/or your care-partner. 

## 2014-11-10 ENCOUNTER — Telehealth: Payer: Self-pay | Admitting: *Deleted

## 2014-11-10 NOTE — Telephone Encounter (Signed)
  Follow up Call-  No flowsheet data found.   Patient questions: Called [phone] [phone]

## 2014-11-27 NOTE — Patient Instructions (Addendum)
Jack Sims  11/27/2014   Your procedure is scheduled on: 12/04/2014    [facility] and follow signs to               Short Stay Center at      0530 AM.  Call this number if you have problems the morning of surgery [phone]   Remember:  Do not eat food or drink liquids :After Midnight.     Take these medicines the morning of surgery with A SIP OF WATER: Claritin if needed                                You may not have any metal on your body including hair pins and              piercings  Do not wear jewelry, make-up, lotions, powders or perfumes., deodorant.                Men may shave face and neck.   Do not bring valuables to the hospital.  IS NOT             RESPONSIBLE   FOR VALUABLES.  Contacts, dentures or bridgework may not be worn into surgery.  Leave suitcase in the car. After surgery it may be brought to your room.         Special Instructions: coughing and deep breathing exercises, leg exercises               Please read over the following fact sheets you were given: _____________________________________________________________________             [facility] - Preparing [facility] Before surgery, you can play an important role.  Because skin is not sterile, your skin needs to be as free of germs as possible.  You can reduce the number of germs on your skin by washing with CHG (chlorahexidine gluconate) soap before surgery.  CHG is an antiseptic cleaner which kills germs and bonds with the skin to continue killing germs even after washing. Please DO NOT use if you have an allergy to CHG or antibacterial soaps.  If your skin becomes reddened/irritated stop using the CHG and inform your nurse when you arrive at Short Stay. Do not shave (including legs and underarms) for at least 48 hours prior to the first CHG shower.  You may shave your face/neck. Please follow these instructions carefully:  1.   Shower with CHG Soap the night before surgery and the  morning of Surgery.  2.  If you choose to wash your hair, wash your hair first as usual with your  normal  shampoo.  3.  After you shampoo, rinse your hair and body thoroughly to remove the  shampoo.                           4.  Use CHG as you would any other liquid soap.  You can apply chg directly  to the skin and wash                       Gently with a scrungie or clean washcloth.  5.  Apply the CHG Soap to your body ONLY FROM THE NECK DOWN.   Do not use  on face/ open                           Wound or open sores. Avoid contact with eyes, ears mouth and genitals (private parts).                       Wash face,  Genitals (private parts) with your normal soap.             6.  Wash thoroughly, paying special attention to the area where your surgery  will be performed.  7.  Thoroughly rinse your body with warm water from the neck down.  8.  DO NOT shower/wash with your normal soap after using and rinsing off  the CHG Soap.                9.  Pat yourself dry with a clean towel.            10.  Wear clean pajamas.            11.  Place clean sheets on your bed the night of your first shower and do not  sleep with pets. Day of Surgery : Do not apply any lotions/deodorants the morning of surgery.  Please wear clean clothes to the hospital/surgery center.  FAILURE TO FOLLOW THESE INSTRUCTIONS MAY RESULT IN THE CANCELLATION OF YOUR SURGERY PATIENT SIGNATURE_________________________________  NURSE SIGNATURE__________________________________  ________________________________________________________________________  WHAT IS A BLOOD TRANSFUSION? Blood Transfusion Information  A transfusion is the replacement of blood or some of its parts. Blood is made up of multiple cells which provide different functions.  Red blood cells carry oxygen and are used for blood loss replacement.  White blood cells fight against infection.  Platelets control  bleeding.  Plasma helps clot blood.  Other blood products are available for specialized needs, such as hemophilia or other clotting disorders. BEFORE THE TRANSFUSION  Who gives blood for transfusions?   Healthy volunteers who are fully evaluated to make sure their blood is safe. This is blood bank blood. Transfusion therapy is the safest it has ever been in the practice of medicine. Before blood is taken from a donor, a complete history is taken to make sure that person has no history of diseases nor engages in risky social behavior (examples are intravenous drug use or sexual activity with multiple partners). The donor's travel history is screened to minimize risk of transmitting infections, such as malaria. The donated blood is tested for signs of infectious diseases, such as HIV and hepatitis. The blood is then tested to be sure it is compatible with you in order to minimize the chance of a transfusion reaction. If you or a relative donates blood, this is often done in anticipation of surgery and is not appropriate for emergency situations. It takes many days to process the donated blood. RISKS AND COMPLICATIONS Although transfusion therapy is very safe and saves many lives, the main dangers of transfusion include:   Getting an infectious disease.  Developing a transfusion reaction. This is an allergic reaction to something in the blood you were given. Every precaution is taken to prevent this. The decision to have a blood transfusion has been considered carefully by your caregiver before blood is given. Blood is not given unless the benefits outweigh the risks. AFTER THE TRANSFUSION  Right after receiving a blood transfusion, you will usually feel much better and more energetic. This is especially true if your  red blood cells have gotten low (anemic). The transfusion raises the level of the red blood cells which carry oxygen, and this usually causes an energy increase.  The nurse  administering the transfusion will monitor you carefully for complications. HOME CARE INSTRUCTIONS  No special instructions are needed after a transfusion. You may find your energy is better. Speak with your caregiver about any limitations on activity for underlying diseases you may have. SEEK MEDICAL CARE IF:   Your condition is not improving after your transfusion.  You develop redness or irritation at the intravenous (IV) site. SEEK IMMEDIATE MEDICAL CARE IF:  Any of the following symptoms occur over the next 12 hours:  Shaking chills.  You have a temperature by mouth above 102 F (38.9 C), not controlled by medicine.  Chest, back, or muscle pain.  People around you feel you are not acting correctly or are confused.  Shortness of breath or difficulty breathing.  Dizziness and fainting.  You get a rash or develop hives.  You have a decrease in urine output.  Your urine turns a dark color or changes to pink, red, or brown. Any of the following symptoms occur over the next 10 days:  You have a temperature by mouth above 102 F (38.9 C), not controlled by medicine.  Shortness of breath.  Weakness after normal activity.  The white part of the eye turns yellow (jaundice).  You have a decrease in the amount of urine or are urinating less often.  Your urine turns a dark color or changes to pink, red, or brown. Document Released: 09/23/2000 Document Revised: 12/19/2011 Document Reviewed: 05/12/2008 [facility] Patient Information 2014 [place], [place].  _______________________________________________________________________

## 2014-12-01 ENCOUNTER — Encounter (HOSPITAL_COMMUNITY): Payer: Self-pay

## 2014-12-01 ENCOUNTER — Other Ambulatory Visit: Payer: Self-pay

## 2014-12-01 ENCOUNTER — Encounter (HOSPITAL_COMMUNITY)
Admission: RE | Admit: 2014-12-01 | Discharge: 2014-12-01 | Disposition: A | Discharge status: Home / Self Care | Payer: BLUE CROSS/BLUE SHIELD | Source: Ambulatory Visit | Attending: Surgery | Admitting: Surgery

## 2014-12-01 ENCOUNTER — Other Ambulatory Visit: Payer: Self-pay | Admitting: Surgery

## 2014-12-01 HISTORY — DX: Unspecified osteoarthritis, unspecified site: M19.90

## 2014-12-01 LAB — CBC
HCT: 45 % (ref 39.0–52.0)
Hemoglobin: 15.3 g/dL (ref 13.0–17.0)
MCH: 32.3 pg (ref 26.0–34.0)
MCHC: 34 g/dL (ref 30.0–36.0)
MCV: 95.1 fL (ref 78.0–100.0)
Platelets: 295 10*3/uL (ref 150–400)
RBC: 4.73 MIL/uL (ref 4.22–5.81)
RDW: 12.5 % (ref 11.5–15.5)
WBC: 6.1 10*3/uL (ref 4.0–10.5)

## 2014-12-01 LAB — BASIC METABOLIC PANEL
Anion gap: 7 (ref 5–15)
BUN: 16 mg/dL (ref 6–23)
CALCIUM: 9.2 mg/dL (ref 8.4–10.5)
CO2: 26 mmol/L (ref 19–32)
CREATININE: 0.89 mg/dL (ref 0.50–1.35)
Chloride: 107 mmol/L (ref 96–112)
GFR calc Af Amer: >90 mL/min (ref >90)
GFR calc non Af Amer: >90 mL/min (ref >90)
GLUCOSE: 117 mg/dL — AB (ref 70–99)
Potassium: 4.2 mmol/L (ref 3.5–5.1)
SODIUM: 140 mmol/L (ref 135–145)

## 2014-12-01 NOTE — Consult Note (Signed)
WOC requested for preoperative stoma site marking  Discussed surgical procedure and stoma creation with patient.  Explained role of the WOC nurse team.  Provided the patient with educational booklet/DVD and provided samples of pouching options.  Pt with no questions today at the time of my visit  Examined patient sitting, and standing in order to place the marking in the patient's visual field, away from any creases or abdominal contour issues and within the rectus muscle.  Attempted to mark below the patient's belt line.    Marked for colostomy in the LLQ  8 cm to the left of the umbilicus and _3___cm below the umbilicus.  Marked for ileostomy in the RLQ  _7___cm to the right of the umbilicus and  _2___ cm above/below the umbilicus.   Covered mark with thin film transparent dressing to preserve mark until date of surgery  WOC team will follow up with patient after surgery for continue ostomy care and teaching.  Lativia Velie [place] RN,CWOCN [phone]

## 2014-12-02 LAB — HEMOGLOBIN A1C
Hgb A1c MFr Bld: 6.7 % — ABNORMAL HIGH (ref 4.8–5.6)
Mean Plasma Glucose: 146 mg/dL

## 2014-12-02 NOTE — Progress Notes (Signed)
Final EKG done 12/01/2014 in [facility].

## 2014-12-02 NOTE — Progress Notes (Signed)
HGA1C done 12/01/14 faxed via EPIC to Dr Michaell CowingGross.

## 2014-12-03 MED ORDER — CLINDAMYCIN PHOSPHATE 900 MG/50ML IV SOLN: 900.0000 mg | INTRAVENOUS | Status: DC

## 2014-12-03 MED ORDER — GENTAMICIN SULFATE 40 MG/ML IJ SOLN
440.0000 mg | INTRAVENOUS | Status: DC
  Filled 2014-12-03: qty 11

## 2014-12-03 NOTE — Anesthesia Preprocedure Evaluation (Addendum)
Anesthesia Evaluation  Patient identified by MRN, date of birth, ID band Patient awake    Reviewed: Allergy & Precautions, H&P , NPO status , Patient's Chart, lab work & pertinent test results  Airway Mallampati: II  TM Distance: >3 FB Neck ROM: full    Dental  (+) Caps, Dental Advisory Given All upper front teeth are capped:   Pulmonary neg pulmonary ROS, former smoker,  breath sounds clear to auscultation  Pulmonary exam normal       Cardiovascular Exercise Tolerance: Good hypertension, negative cardio ROS  Rhythm:regular Rate:Normal     Neuro/Psych negative neurological ROS  negative psych ROS   GI/Hepatic negative GI ROS, Neg liver ROS,   Endo/Other  negative endocrine ROS  Renal/GU negative Renal ROS  negative genitourinary   Musculoskeletal   Abdominal   Peds  Hematology negative hematology ROS (+)   Anesthesia Other Findings   Reproductive/Obstetrics negative OB ROS                            Anesthesia Physical Anesthesia Plan  ASA: II  Anesthesia Plan: General   Post-op Pain Management:    Induction: Intravenous  Airway Management Planned: Oral ETT  Additional Equipment:   Intra-op Plan:   Post-operative Plan: Extubation in OR  Informed Consent: I have reviewed the patients History and Physical, chart, labs and discussed the procedure including the risks, benefits and alternatives for the proposed anesthesia with the patient or authorized representative who has indicated his/her understanding and acceptance.   Dental Advisory Given  Plan Discussed with: CRNA and Surgeon  Anesthesia Plan Comments:         Anesthesia Quick Evaluation

## 2014-12-04 ENCOUNTER — Encounter (HOSPITAL_COMMUNITY): Payer: Self-pay | Admitting: *Deleted

## 2014-12-04 ENCOUNTER — Inpatient Hospital Stay (HOSPITAL_COMMUNITY): Payer: BLUE CROSS/BLUE SHIELD | Admitting: Anesthesiology

## 2014-12-04 ENCOUNTER — Encounter (HOSPITAL_COMMUNITY)
Admission: RE | Disposition: A | Discharge status: Home / Self Care | Payer: Self-pay | Source: Ambulatory Visit | Attending: Surgery

## 2014-12-04 ENCOUNTER — Inpatient Hospital Stay (HOSPITAL_COMMUNITY)
Admission: RE | Admit: 2014-12-04 | Discharge: 2014-12-07 | DRG: 331 | Disposition: A | Discharge status: Home / Self Care | Payer: BLUE CROSS/BLUE SHIELD | Source: Ambulatory Visit | Attending: Surgery | Admitting: Surgery

## 2014-12-04 DIAGNOSIS — F17210 Nicotine dependence, cigarettes, uncomplicated: Secondary | ICD-10-CM | POA: Diagnosis present

## 2014-12-04 DIAGNOSIS — K5732 Diverticulitis of large intestine without perforation or abscess without bleeding: Secondary | ICD-10-CM | POA: Diagnosis present

## 2014-12-04 DIAGNOSIS — I10 Essential (primary) hypertension: Secondary | ICD-10-CM | POA: Diagnosis present

## 2014-12-04 DIAGNOSIS — Z833 Family history of diabetes mellitus: Secondary | ICD-10-CM | POA: Diagnosis not present

## 2014-12-04 DIAGNOSIS — Z800 Family history of malignant neoplasm of digestive organs: Secondary | ICD-10-CM | POA: Diagnosis not present

## 2014-12-04 DIAGNOSIS — Z01812 Encounter for preprocedural laboratory examination: Secondary | ICD-10-CM

## 2014-12-04 HISTORY — PX: PROCTOSCOPY: SHX2266

## 2014-12-04 LAB — TYPE AND SCREEN
ABO/RH(D): A POS
ANTIBODY SCREEN: NEGATIVE

## 2014-12-04 SURGERY — COLECTOMY, PARTIAL, ROBOT-ASSISTED, LAPAROSCOPIC
Anesthesia: General | Site: Rectum

## 2014-12-04 MED ORDER — BUPIVACAINE-EPINEPHRINE 0.25% -1:200000 IJ SOLN
INTRAMUSCULAR | Status: DC | PRN
  Administered 2014-12-04: 80 mL

## 2014-12-04 MED ORDER — ONDANSETRON HCL 4 MG/2ML IJ SOLN: 4.0000 mg | Freq: Four times a day (QID) | INTRAMUSCULAR | Status: DC | PRN

## 2014-12-04 MED ORDER — MENTHOL 3 MG MT LOZG
1.0000 | LOZENGE | OROMUCOSAL | Status: DC | PRN
  Filled 2014-12-04: qty 9

## 2014-12-04 MED ORDER — HYDROMORPHONE HCL 1 MG/ML IJ SOLN
0.2500 mg | INTRAMUSCULAR | Status: DC | PRN
  Administered 2014-12-04 (×2): 0.5 mg via INTRAVENOUS

## 2014-12-04 MED ORDER — OXYCODONE HCL 5 MG PO TABS: 5.0000 mg | ORAL_TABLET | ORAL | Status: DC | PRN

## 2014-12-04 MED ORDER — LACTATED RINGERS IV SOLN
INTRAVENOUS | Status: DC | PRN
  Administered 2014-12-04: 07:00:00 via INTRAVENOUS

## 2014-12-04 MED ORDER — LIDOCAINE HCL (CARDIAC) 20 MG/ML IV SOLN
INTRAVENOUS | Status: DC | PRN
  Administered 2014-12-04: 50 mg via INTRAVENOUS

## 2014-12-04 MED ORDER — FENTANYL CITRATE 0.05 MG/ML IJ SOLN
INTRAMUSCULAR | Status: AC
  Filled 2014-12-04: qty 5

## 2014-12-04 MED ORDER — DEXTROSE 5 % IV SOLN
2.0000 g | INTRAVENOUS | Status: AC
  Administered 2014-12-04: 2 g via INTRAVENOUS

## 2014-12-04 MED ORDER — LIDOCAINE HCL (CARDIAC) 20 MG/ML IV SOLN
INTRAVENOUS | Status: AC
  Filled 2014-12-04: qty 5

## 2014-12-04 MED ORDER — ACETAMINOPHEN 500 MG PO TABS
1000.0000 mg | ORAL_TABLET | Freq: Three times a day (TID) | ORAL | Status: DC
  Administered 2014-12-04 – 2014-12-07 (×9): 1000 mg via ORAL
  Filled 2014-12-04 (×12): qty 2

## 2014-12-04 MED ORDER — MIDAZOLAM HCL 5 MG/5ML IJ SOLN
INTRAMUSCULAR | Status: DC | PRN
  Administered 2014-12-04: 2 mg via INTRAVENOUS

## 2014-12-04 MED ORDER — HYDROMORPHONE HCL 1 MG/ML IJ SOLN
INTRAMUSCULAR | Status: AC
  Filled 2014-12-04: qty 1

## 2014-12-04 MED ORDER — LACTATED RINGERS IV SOLN: INTRAVENOUS | Status: DC

## 2014-12-04 MED ORDER — PROPOFOL 10 MG/ML IV BOLUS
INTRAVENOUS | Status: AC
  Filled 2014-12-04: qty 20

## 2014-12-04 MED ORDER — CHLORHEXIDINE GLUCONATE 4 % EX LIQD: 60.0000 mL | Freq: Once | CUTANEOUS | Status: DC

## 2014-12-04 MED ORDER — LACTATED RINGERS IR SOLN
Status: DC | PRN
  Administered 2014-12-04: 1000 mL

## 2014-12-04 MED ORDER — DEXTROSE 5 % IV SOLN
2.0000 g | Freq: Two times a day (BID) | INTRAVENOUS | Status: AC
  Administered 2014-12-04: 2 g via INTRAVENOUS
  Filled 2014-12-04: qty 2

## 2014-12-04 MED ORDER — HEPARIN SODIUM (PORCINE) 5000 UNIT/ML IJ SOLN
5000.0000 [IU] | Freq: Three times a day (TID) | INTRAMUSCULAR | Status: DC
  Administered 2014-12-05 – 2014-12-07 (×7): 5000 [IU] via SUBCUTANEOUS
  Filled 2014-12-04 (×10): qty 1

## 2014-12-04 MED ORDER — FENTANYL CITRATE 0.05 MG/ML IJ SOLN
INTRAMUSCULAR | Status: DC | PRN
  Administered 2014-12-04: 50 ug via INTRAVENOUS
  Administered 2014-12-04: 100 ug via INTRAVENOUS
  Administered 2014-12-04 (×5): 50 ug via INTRAVENOUS
  Administered 2014-12-04: 25 ug via INTRAVENOUS

## 2014-12-04 MED ORDER — SODIUM CHLORIDE 0.9 % IV SOLN
INTRAVENOUS | Status: DC
  Filled 2014-12-04: qty 6

## 2014-12-04 MED ORDER — ROCURONIUM BROMIDE 100 MG/10ML IV SOLN
INTRAVENOUS | Status: DC | PRN
  Administered 2014-12-04: 40 mg via INTRAVENOUS
  Administered 2014-12-04: 10 mg via INTRAVENOUS
  Administered 2014-12-04: 5 mg via INTRAVENOUS
  Administered 2014-12-04: 10 mg via INTRAVENOUS

## 2014-12-04 MED ORDER — LIP MEDEX EX OINT
1.0000 "application " | TOPICAL_OINTMENT | Freq: Two times a day (BID) | CUTANEOUS | Status: DC
  Administered 2014-12-04 – 2014-12-07 (×5): 1 via TOPICAL
  Filled 2014-12-04: qty 7

## 2014-12-04 MED ORDER — BUPIVACAINE-EPINEPHRINE 0.25% -1:200000 IJ SOLN
INTRAMUSCULAR | Status: AC
  Filled 2014-12-04: qty 1

## 2014-12-04 MED ORDER — BUPIVACAINE-EPINEPHRINE (PF) 0.25% -1:200000 IJ SOLN
INTRAMUSCULAR | Status: AC
  Filled 2014-12-04: qty 30

## 2014-12-04 MED ORDER — MAGIC MOUTHWASH
15.0000 mL | Freq: Four times a day (QID) | ORAL | Status: DC | PRN
  Filled 2014-12-04: qty 15

## 2014-12-04 MED ORDER — GLYCOPYRROLATE 0.2 MG/ML IJ SOLN
INTRAMUSCULAR | Status: DC | PRN
  Administered 2014-12-04: 0.6 mg via INTRAVENOUS
  Administered 2014-12-04: 0.2 mg via INTRAVENOUS

## 2014-12-04 MED ORDER — EPHEDRINE SULFATE 50 MG/ML IJ SOLN
INTRAMUSCULAR | Status: DC | PRN
  Administered 2014-12-04 (×2): 5 mg via INTRAVENOUS

## 2014-12-04 MED ORDER — PROPOFOL 10 MG/ML IV BOLUS
INTRAVENOUS | Status: DC | PRN
  Administered 2014-12-04: 230 mg via INTRAVENOUS

## 2014-12-04 MED ORDER — MIDAZOLAM HCL 2 MG/2ML IJ SOLN
INTRAMUSCULAR | Status: AC
  Filled 2014-12-04: qty 2

## 2014-12-04 MED ORDER — SODIUM CHLORIDE 0.9 % IJ SOLN
INTRAMUSCULAR | Status: AC
  Filled 2014-12-04: qty 10

## 2014-12-04 MED ORDER — CEFOTETAN DISODIUM-DEXTROSE 2-2.08 GM-% IV SOLR
INTRAVENOUS | Status: AC
  Filled 2014-12-04: qty 50

## 2014-12-04 MED ORDER — LORATADINE 10 MG PO TABS
10.0000 mg | ORAL_TABLET | Freq: Every day | ORAL | Status: DC | PRN
  Filled 2014-12-04: qty 1

## 2014-12-04 MED ORDER — BUPIVACAINE 0.25 % ON-Q PUMP DUAL CATH 300 ML
300.0000 mL | INJECTION | Status: DC
  Filled 2014-12-04: qty 300

## 2014-12-04 MED ORDER — DEXAMETHASONE SODIUM PHOSPHATE 10 MG/ML IJ SOLN
INTRAMUSCULAR | Status: DC | PRN
  Administered 2014-12-04: 10 mg via INTRAVENOUS

## 2014-12-04 MED ORDER — LACTATED RINGERS IV SOLN
INTRAVENOUS | Status: DC | PRN
  Administered 2014-12-04: 08:00:00 via INTRAVENOUS

## 2014-12-04 MED ORDER — NEOSTIGMINE METHYLSULFATE 10 MG/10ML IV SOLN
INTRAVENOUS | Status: AC
  Filled 2014-12-04: qty 1

## 2014-12-04 MED ORDER — SODIUM CHLORIDE 0.9 % IJ SOLN
INTRAMUSCULAR | Status: AC
  Filled 2014-12-04: qty 20

## 2014-12-04 MED ORDER — EPHEDRINE SULFATE 50 MG/ML IJ SOLN
INTRAMUSCULAR | Status: AC
  Filled 2014-12-04: qty 1

## 2014-12-04 MED ORDER — GLYCOPYRROLATE 0.2 MG/ML IJ SOLN
INTRAMUSCULAR | Status: AC
  Filled 2014-12-04: qty 2

## 2014-12-04 MED ORDER — LACTATED RINGERS IV SOLN
INTRAVENOUS | Status: DC
  Administered 2014-12-05: 13:00:00 via INTRAVENOUS

## 2014-12-04 MED ORDER — ALUM & MAG HYDROXIDE-SIMETH 200-200-20 MG/5ML PO SUSP
30.0000 mL | Freq: Four times a day (QID) | ORAL | Status: DC | PRN
  Administered 2014-12-05 (×2): 30 mL via ORAL
  Filled 2014-12-04 (×2): qty 30

## 2014-12-04 MED ORDER — ASPIRIN 81 MG PO CHEW
81.0000 mg | CHEWABLE_TABLET | Freq: Every day | ORAL | Status: DC
  Administered 2014-12-04 – 2014-12-07 (×4): 81 mg via ORAL
  Filled 2014-12-04 (×4): qty 1

## 2014-12-04 MED ORDER — ALVIMOPAN 12 MG PO CAPS
12.0000 mg | ORAL_CAPSULE | Freq: Two times a day (BID) | ORAL | Status: DC
  Administered 2014-12-05 – 2014-12-06 (×4): 12 mg via ORAL
  Filled 2014-12-04 (×6): qty 1

## 2014-12-04 MED ORDER — SODIUM CHLORIDE 0.9 % IV SOLN
INTRAVENOUS | Status: DC
  Administered 2014-12-04: 15:00:00 via INTRAVENOUS
  Administered 2014-12-05: 75 mL/h via INTRAVENOUS

## 2014-12-04 MED ORDER — SODIUM CHLORIDE 0.9 % IV SOLN
INTRAVENOUS | Status: DC | PRN
  Administered 2014-12-04: 1000 mL via INTRAPERITONEAL

## 2014-12-04 MED ORDER — SACCHAROMYCES BOULARDII 250 MG PO CAPS
250.0000 mg | ORAL_CAPSULE | Freq: Two times a day (BID) | ORAL | Status: DC
  Administered 2014-12-04 – 2014-12-07 (×7): 250 mg via ORAL
  Filled 2014-12-04 (×8): qty 1

## 2014-12-04 MED ORDER — ONDANSETRON HCL 4 MG/2ML IJ SOLN
INTRAMUSCULAR | Status: DC | PRN
  Administered 2014-12-04 (×2): 4 mg via INTRAVENOUS

## 2014-12-04 MED ORDER — ONDANSETRON HCL 4 MG PO TABS: 4.0000 mg | ORAL_TABLET | Freq: Four times a day (QID) | ORAL | Status: DC | PRN

## 2014-12-04 MED ORDER — DIPHENHYDRAMINE HCL 12.5 MG/5ML PO ELIX: 12.5000 mg | ORAL_SOLUTION | Freq: Four times a day (QID) | ORAL | Status: DC | PRN

## 2014-12-04 MED ORDER — KETOROLAC TROMETHAMINE 30 MG/ML IJ SOLN
INTRAMUSCULAR | Status: DC | PRN
  Administered 2014-12-04: 30 mg via INTRAVENOUS

## 2014-12-04 MED ORDER — METOPROLOL TARTRATE 1 MG/ML IV SOLN
5.0000 mg | Freq: Four times a day (QID) | INTRAVENOUS | Status: DC
  Administered 2014-12-04 – 2014-12-07 (×7): 5 mg via INTRAVENOUS
  Filled 2014-12-04 (×16): qty 5

## 2014-12-04 MED ORDER — HEPARIN SODIUM (PORCINE) 5000 UNIT/ML IJ SOLN
5000.0000 [IU] | Freq: Once | INTRAMUSCULAR | Status: AC
  Administered 2014-12-04: 5000 [IU] via SUBCUTANEOUS
  Filled 2014-12-04: qty 1

## 2014-12-04 MED ORDER — DEXAMETHASONE SODIUM PHOSPHATE 10 MG/ML IJ SOLN
INTRAMUSCULAR | Status: AC
  Filled 2014-12-04: qty 1

## 2014-12-04 MED ORDER — NEOSTIGMINE METHYLSULFATE 10 MG/10ML IV SOLN
INTRAVENOUS | Status: DC | PRN
  Administered 2014-12-04: 4 mg via INTRAVENOUS

## 2014-12-04 MED ORDER — HYDROMORPHONE HCL 1 MG/ML IJ SOLN
0.5000 mg | INTRAMUSCULAR | Status: DC | PRN
  Administered 2014-12-04: 1 mg via INTRAVENOUS
  Administered 2014-12-04 – 2014-12-05 (×2): 2 mg via INTRAVENOUS
  Administered 2014-12-05 (×2): 1 mg via INTRAVENOUS
  Administered 2014-12-05: 2 mg via INTRAVENOUS
  Administered 2014-12-05: 1 mg via INTRAVENOUS
  Administered 2014-12-06: 2 mg via INTRAVENOUS
  Filled 2014-12-04: qty 2
  Filled 2014-12-04 (×2): qty 1
  Filled 2014-12-04 (×3): qty 2
  Filled 2014-12-04 (×2): qty 1

## 2014-12-04 MED ORDER — LACTATED RINGERS IV BOLUS (SEPSIS): 1000.0000 mL | Freq: Three times a day (TID) | INTRAVENOUS | Status: AC | PRN

## 2014-12-04 MED ORDER — 0.9 % SODIUM CHLORIDE (POUR BTL) OPTIME
TOPICAL | Status: DC | PRN
  Administered 2014-12-04: 2000 mL

## 2014-12-04 MED ORDER — PHENOL 1.4 % MT LIQD: 2.0000 | OROMUCOSAL | Status: DC | PRN

## 2014-12-04 MED ORDER — DIPHENHYDRAMINE HCL 50 MG/ML IJ SOLN: 12.5000 mg | Freq: Four times a day (QID) | INTRAMUSCULAR | Status: DC | PRN

## 2014-12-04 MED ORDER — ROCURONIUM BROMIDE 100 MG/10ML IV SOLN
INTRAVENOUS | Status: AC
  Filled 2014-12-04: qty 1

## 2014-12-04 MED ORDER — ALVIMOPAN 12 MG PO CAPS
12.0000 mg | ORAL_CAPSULE | Freq: Once | ORAL | Status: AC
  Administered 2014-12-04: 12 mg via ORAL
  Filled 2014-12-04: qty 1

## 2014-12-04 MED ORDER — ONDANSETRON HCL 4 MG/2ML IJ SOLN
INTRAMUSCULAR | Status: AC
  Filled 2014-12-04: qty 2

## 2014-12-04 SURGICAL SUPPLY — 111 items
APPLIER CLIP 5 13 M/L LIGAMAX5 (MISCELLANEOUS)
APPLIER CLIP ROT 10 11.4 M/L (STAPLE)
BLADE EXTENDED COATED 6.5IN (ELECTRODE) ×4 IMPLANT
BLADE SURG SZ11 CARB STEEL (BLADE) ×4 IMPLANT
CABLE HIGH FREQUENCY MONO STRZ (ELECTRODE) ×4 IMPLANT
CANNULA REDUC XI 12-8 STAPL (CANNULA) ×1
CANNULA REDUC XI 12-8MM STAPL (CANNULA) ×1
CANNULA REDUCER 12-8 DVNC XI (CANNULA) ×2 IMPLANT
CATH KIT ON-Q SILVERSOAK 7.5IN (CATHETERS) IMPLANT
CATH ROBINSON RED A/P 22FR (CATHETERS) IMPLANT
CELLS DAT CNTRL 66122 CELL SVR (MISCELLANEOUS) IMPLANT
CLIP APPLIE 5 13 M/L LIGAMAX5 (MISCELLANEOUS) IMPLANT
CLIP APPLIE ROT 10 11.4 M/L (STAPLE) IMPLANT
CLIP LIGATING HEM O LOK PURPLE (MISCELLANEOUS) IMPLANT
CLIP LIGATING HEMO O LOK GREEN (MISCELLANEOUS) IMPLANT
CLIP LIGATING HEMOLOK MED (MISCELLANEOUS) IMPLANT
COVER TIP SHEARS 8 DVNC (MISCELLANEOUS) ×2 IMPLANT
COVER TIP SHEARS 8MM DA VINCI (MISCELLANEOUS) ×2
DECANTER SPIKE VIAL GLASS SM (MISCELLANEOUS) IMPLANT
DEVICE TROCAR PUNCTURE CLOSURE (ENDOMECHANICALS) ×4 IMPLANT
DRAIN CHANNEL 19F RND (DRAIN) IMPLANT
DRAPE ARM DVNC X/XI (DISPOSABLE) ×10 IMPLANT
DRAPE COLUMN DVNC XI (DISPOSABLE) ×2 IMPLANT
DRAPE DA VINCI XI ARM (DISPOSABLE) ×10
DRAPE DA VINCI XI COLUMN (DISPOSABLE) ×2
DRAPE SURG IRRIG POUCH 19X23 (DRAPES) ×4 IMPLANT
DRSG OPSITE POSTOP 4X10 (GAUZE/BANDAGES/DRESSINGS) IMPLANT
DRSG OPSITE POSTOP 4X6 (GAUZE/BANDAGES/DRESSINGS) IMPLANT
DRSG OPSITE POSTOP 4X8 (GAUZE/BANDAGES/DRESSINGS) ×4 IMPLANT
DRSG TEGADERM 2-3/8X2-3/4 SM (GAUZE/BANDAGES/DRESSINGS) ×32 IMPLANT
DRSG TEGADERM 4X4.75 (GAUZE/BANDAGES/DRESSINGS) IMPLANT
ELECT PENCIL ROCKER SW 15FT (MISCELLANEOUS) IMPLANT
ELECT REM PT RETURN 9FT ADLT (ELECTROSURGICAL) ×4
ELECTRODE REM PT RTRN 9FT ADLT (ELECTROSURGICAL) ×2 IMPLANT
ENDOLOOP SUT PDS II  0 18 (SUTURE)
ENDOLOOP SUT PDS II 0 18 (SUTURE) IMPLANT
EVACUATOR SILICONE 100CC (DRAIN) IMPLANT
GAUZE SPONGE 2X2 8PLY STRL LF (GAUZE/BANDAGES/DRESSINGS) ×2 IMPLANT
GAUZE SPONGE 4X4 12PLY STRL (GAUZE/BANDAGES/DRESSINGS) IMPLANT
GLOVE BIO SURGEON STRL SZ7 (GLOVE) ×8 IMPLANT
GLOVE BIOGEL PI IND STRL 7.0 (GLOVE) ×12 IMPLANT
GLOVE BIOGEL PI INDICATOR 7.0 (GLOVE) ×12
GLOVE ECLIPSE 7.0 STRL STRAW (GLOVE) ×12 IMPLANT
GLOVE ECLIPSE 8.0 STRL XLNG CF (GLOVE) ×12 IMPLANT
GLOVE INDICATOR 8.0 STRL GRN (GLOVE) ×12 IMPLANT
GLOVE SURG SS PI 6.5 STRL IVOR (GLOVE) ×12 IMPLANT
GOWN STRL REUS W/TWL XL LVL3 (GOWN DISPOSABLE) ×24 IMPLANT
KIT PROCEDURE DA VINCI SI (MISCELLANEOUS) ×2
KIT PROCEDURE DVNC SI (MISCELLANEOUS) ×2 IMPLANT
LEGGING LITHOTOMY PAIR STRL (DRAPES) ×4 IMPLANT
LUBRICANT JELLY K Y 4OZ (MISCELLANEOUS) ×4 IMPLANT
NEEDLE INSUFFLATION 14GA 120MM (NEEDLE) ×4 IMPLANT
PACK CARDIOVASCULAR III (CUSTOM PROCEDURE TRAY) ×4 IMPLANT
PACK COLON (CUSTOM PROCEDURE TRAY) ×4 IMPLANT
PACK GENERAL/GYN (CUSTOM PROCEDURE TRAY) ×4 IMPLANT
PEN SKIN MARKING BROAD (MISCELLANEOUS) IMPLANT
PORT LAP GEL ALEXIS MED 5-9CM (MISCELLANEOUS) ×4 IMPLANT
PUMP PAIN ON-Q (MISCELLANEOUS) IMPLANT
RTRCTR WOUND ALEXIS 18CM MED (MISCELLANEOUS)
SCISSORS LAP 5X45 EPIX DISP (ENDOMECHANICALS) IMPLANT
SCRUB PCMX 4 OZ (MISCELLANEOUS) IMPLANT
SEAL CANN UNIV 5-8 DVNC XI (MISCELLANEOUS) ×6 IMPLANT
SEAL XI 5MM-8MM UNIVERSAL (MISCELLANEOUS) ×6
SEALER TISSUE G2 STRG ARTC 35C (ENDOMECHANICALS) IMPLANT
SEALER VESSEL DA VINCI XI (MISCELLANEOUS)
SEALER VESSEL EXT DVNC XI (MISCELLANEOUS) IMPLANT
SET IRRIG TUBING LAPAROSCOPIC (IRRIGATION / IRRIGATOR) ×4 IMPLANT
SLEEVE XCEL OPT CAN 5 100 (ENDOMECHANICALS) IMPLANT
SOLUTION ELECTROLUBE (MISCELLANEOUS) ×4 IMPLANT
SPONGE GAUZE 2X2 STER 10/PKG (GAUZE/BANDAGES/DRESSINGS) ×2
STAPLER 45 BLU RELOAD XI (STAPLE) IMPLANT
STAPLER 45 BLUE RELOAD SI (STAPLE) ×8 IMPLANT
STAPLER 45 BLUE RELOAD XI (STAPLE)
STAPLER 45 GREEN RELOAD XI (STAPLE)
STAPLER 45 GRN RELOAD XI (STAPLE) IMPLANT
STAPLER CANNULA SEAL DVNC XI (STAPLE) ×2 IMPLANT
STAPLER CANNULA SEAL XI (STAPLE) ×2
STAPLER CIRC ILS CVD 33MM 37CM (STAPLE) ×4 IMPLANT
STAPLER SHEATH (SHEATH)
STAPLER SHEATH ENDOWRIST DVNC (SHEATH) IMPLANT
SUT MNCRL AB 4-0 PS2 18 (SUTURE) ×8 IMPLANT
SUT PDS AB 1 CTX 36 (SUTURE) ×8 IMPLANT
SUT PDS AB 1 TP1 96 (SUTURE) IMPLANT
SUT PDS AB 2-0 CT2 27 (SUTURE) IMPLANT
SUT PROLENE 0 CT 2 (SUTURE) ×8 IMPLANT
SUT PROLENE 2 0 SH DA (SUTURE) IMPLANT
SUT SILK 2 0 (SUTURE) ×2
SUT SILK 2 0 SH CR/8 (SUTURE) ×4 IMPLANT
SUT SILK 2-0 18XBRD TIE 12 (SUTURE) ×2 IMPLANT
SUT SILK 3 0 (SUTURE) ×2
SUT SILK 3 0 SH CR/8 (SUTURE) ×4 IMPLANT
SUT SILK 3-0 18XBRD TIE 12 (SUTURE) ×2 IMPLANT
SUT V-LOC BARB 180 2/0GR6 GS22 (SUTURE)
SUT VIC AB 3-0 SH 18 (SUTURE) IMPLANT
SUT VIC AB 3-0 SH 27 (SUTURE)
SUT VIC AB 3-0 SH 27XBRD (SUTURE) IMPLANT
SUT VICRYL 0 UR6 27IN ABS (SUTURE) ×8 IMPLANT
SUT VLOC 180 2-0 9IN GS21 (SUTURE) IMPLANT
SUTURE V-LC BRB 180 2/0GR6GS22 (SUTURE) IMPLANT
SYRINGE 10CC LL (SYRINGE) ×4 IMPLANT
SYS LAPSCP GELPORT 120MM (MISCELLANEOUS)
SYSTEM LAPSCP GELPORT 120MM (MISCELLANEOUS) IMPLANT
TAPE UMBILICAL COTTON 1/8X30 (MISCELLANEOUS) IMPLANT
TOWEL OR NON WOVEN STRL DISP B (DISPOSABLE) ×4 IMPLANT
TRAY FOLEY CATH 14FRSI W/METER (CATHETERS) IMPLANT
TRAY FOLEY CATH 16FRSI W/METER (SET/KITS/TRAYS/PACK) ×4 IMPLANT
TROCAR BLADELESS OPT 5 100 (ENDOMECHANICALS) ×4 IMPLANT
TUBING CONNECTING 10 (TUBING) IMPLANT
TUBING CONNECTING 10' (TUBING)
TUBING FILTER THERMOFLATOR (ELECTROSURGICAL) ×4 IMPLANT
TUNNELER SHEATH ON-Q 16GX12 DP (PAIN MANAGEMENT) IMPLANT

## 2014-12-04 NOTE — Interval H&P Note (Signed)
History and Physical Interval Note:  12/04/2014 7:24 AM  Jack Sims  has presented today for surgery, with the diagnosis of Sigmoid Diverticulitis  The various methods of treatment have been discussed with the patient and family. After consideration of risks, benefits and other options for treatment, the patient has consented to  Procedure(s): XI ROBOT ASSISTED SIGMOID COLECTOMY (N/A) PROCTOSCOPY (N/A) as a surgical intervention .  The patient's history has been reviewed, patient examined, no change in status, stable for surgery.  I have reviewed the patient's chart and labs.  Questions were answered to the patient's satisfaction.     Kada Friesen C.

## 2014-12-04 NOTE — Anesthesia Postprocedure Evaluation (Signed)
  Anesthesia Post-op Note  Patient: Jack Sims  Procedure(s) Performed: Procedure(s) (LRB): XI ROBOT ASSISTED SIGMOID COLECTOMY FOR SIGMOID DIVERTICULITIS (N/A) RIGID PROCTOSCOPY (N/A)  Patient Location: PACU  Anesthesia Type: General  Level of Consciousness: awake and alert   Airway and Oxygen Therapy: Patient Spontanous Breathing  Post-op Pain: mild  Post-op Assessment: Post-op Vital signs reviewed, Patient's Cardiovascular Status Stable, Respiratory Function Stable, Patent Airway and No signs of Nausea or vomiting  Last Vitals:  Filed Vitals:   12/04/14 1215  BP: 156/82  Pulse: 72  Temp: 36.3 C  Resp: 12    Post-op Vital Signs: stable   Complications: No apparent anesthesia complications

## 2014-12-04 NOTE — Op Note (Signed)
12/04/2014  10:49 AM  PATIENT:  Jack Sims  54 y.o. male  Patient Care Team: Zachery DauerJames T Milam, MD as PCP - General (Family Medicine) Karie SodaSteven Lyndsey Demos, MD as Consulting Physician (General Surgery) Louis Meckelobert D Kaplan, MD as Consulting Physician (Gastroenterology)  PRE-OPERATIVE DIAGNOSIS:  Sigmoid Diverticulitis  POST-OPERATIVE DIAGNOSIS:  Sigmoid Diverticulitis  PROCEDURE:  Procedure(s): XI ROBOT ASSISTED SIGMOID COLECTOMY RIGID PROCTOSCOPY  SURGEON:  Surgeon(s): Karie SodaSteven Cloy Cozzens, MD Romie LeveeAlicia Thomas, MD - Assist  ANESTHESIA:   local and general  EBL:  Total I/O In: 1500 [I.V.:1500] Out: 600 [Urine:450; Blood:150]  Delay start of Pharmacological VTE agent (>24hrs) due to surgical blood loss or risk of bleeding:  no  DRAINS:  NONE  SPECIMEN:  Source of Specimen:  RECTOSIGMOID COLON  DISPOSITION OF SPECIMEN:  PATHOLOGY  COUNTS:  YES  PLAN OF CARE: Admit to inpatient   PATIENT DISPOSITION:  PACU - hemodynamically stable.  INDICATION:    Pleasant patient with recurrent sigmoid diverticulitis with evidence of inflammation and possible stricture.  Biopsies benign.  I recommended segmental resection:  The anatomy & physiology of the digestive tract was discussed.  The pathophysiology was discussed.  Natural history risks without surgery was discussed.   I worked to give an overview of the disease and the frequent need to have multispecialty involvement.  I feel the risks of no intervention will lead to serious problems that outweigh the operative risks; therefore, I recommended a partial colectomy to remove the pathology.  Laparoscopic & open techniques were discussed.   Risks such as bleeding, infection, abscess, leak, reoperation, possible ostomy, hernia, heart attack, death, and other risks were discussed.  I noted a good likelihood this will help address the problem.   Goals of post-operative recovery were discussed as well.  We will work to minimize complications.  Educational materials  on the pathology had been given in the office.  Questions were answered.    The patient expressed understanding & wished to proceed with surgery.  OR FINDINGS:   Patient had inflamed thickened sigmoid colon.  Somewhat foreshortened.  No obvious metastatic disease on visceral parietal peritoneum or liver.  The anastomosis rests 15 cm from the anal verge by rigid proctoscopy.  DESCRIPTION:   Informed consent was confirmed.  The patient underwent general anaesthesia without difficulty.  The patient was positioned appropriately.  VTE prevention in place.  The patient's abdomen was clipped, prepped, & draped in a sterile fashion.  Surgical timeout confirmed our plan.  The patient was positioned in reverse Trendelenburg.  Abdominal entry was gained using Varess technique with a trach hook on the anterior abdominal wall fascia in the left upper abdomen.  Entry was clean.  I induced carbon dioxide insufflation.  Camera inspection revealed no injury.  Extra ports were carefully placed under direct laparoscopic visualization.  I reflected the greater omentum and the upper abdomen the small bowel in the upper abdomen.  The patient was carefully positioned.  The [employer]uitive daVinci robot was carefully docked with camera & instruments carefully placed.  The patient had thickened foreshortened sigmoid colon was some adhesions.  Was able to elevate the rectosigmoid colon anteriorly.  I scored the base of peritoneum of the medial side of the mesentery of the left colon from the ligament of Treitz to the peritoneal reflection of the mid rectum.   I elevated the sigmoid mesentery and entered into the retro-mesenteric plane. We were able to identify the left ureter and gonadal vessels. We kept those posterior within the retroperitoneum  and elevated the left colon mesentery off that. I did isolate the inferior mesenteric artery (IMA) pedicle but did not ligate it yet.  I continued distally and got into the avascular  plane posterior to the mesorectum. This allowed me to help mobilize the rectum as well by freeing the mesorectum off the sacrum.  I mobilized the peritoneal coverings towards the peritoneal reflection on both the right and left sides of the rectum.  I stayed away from the right and left ureters.  I kept the lateral vascular pedicles to the rectum intact.  I skeletonized the lymph nodes off the inferior mesenteric artery pedicle.  I went down to its takeoff from the aorta.  I isolated the inferior mesenteric vein off of the ligament of Treitz just cephalad to that as well.  After confirming the left ureter was out of the way, I went ahead and ligated the inferior mesenteric artery pedicle 3cm off its takeoff from the aorta.  I did ligate the inferior mesenteric vein in a similar fashion.  We ensured hemostasis. I skeletonized the mesorectum at the junction at the proximal rectum for the distal point of resection.  I mobilized the left colon in a lateral to medial fashion off the line of Toldt up towards the splenic flexure to ensure good mobilization of the remaining left colon to reach into the pelvis.  I skeletonized at the proximal mesorectum and transected at the proximal rectum using a robotic 45 mm stapler x 2 firings.  I chose a region at the descending/sigmoid junction that was soft and easily reached down to the rectal stump.  I transected the mesentery of the colon radially to preserve remaining colon blood supply.  I placed a wound protector through a Pfannenstiel incision in the suprapubic region, taking care to avoid bladder injury.  I was able to eviscerate the rectosigmoid and descending colon out the wound.   I clamped the colon proximal to this area using a soft bowel clamp. I transected at the descending/sigmoid junction with a scalpel. I got healthy bleeding mucosa.  We sent the rectosigmoid colon specimen off to go to pathology.  We sized the colon orifice.  I chose a  33 EEA anvil stapler  system. I placed the anvil to the open end of the proximal remaining colon and closed around it using a 0 Prolene pursestring.  We did copious irrigation with crystalloid solution.  Hemostasis was good.  The distal end of the remaining colon easily reached down to the rectal stump, therefore, splenic flexure mobilization was not needed.      Dr Maisie Fus scrubbed down and did gentle anal dilation and advanced the EEA stapler up the rectal stump. The spike was brought out at the provimal end of the rectal stump under direct visualization.  I laparoscopically attached the anvil of the proximal colon the spike of the stapler. Anvil was tightened down and held clamped for 60 seconds. The EEA stapler was fired and held clamped for 30 seconds. The stapler was released & removed. We noted 2 excellent anastomotic rings. Blue stitch is in the proximal ring.  Dr Maisie Fus did rigid proctoscopy noted the anastomosis was at 15 cm from the anal verge consistent with the proximal rectum.  We did a final irrigation of antibiotic solution (900 mg clindamycin/240 mg gentamicin in a liter of crystalloid) & held that for the pelvic air leak test .  The rectum was insufflated the rectum while clamping the colon proximal to that anastomosis.  There was a negative air leak test. There was no tension of mesentery or bowel at the anastomosis.   Tissues looked viable.  Ureters & bowel uninjured.  The anastomosis looked healthy.  Endoluminal gas was evacuated.  Ports & wound protector removed.  We changed gloves.  We aspirated the antibiotic irrigation.  Hemostasis was good.  Sterile unused instruments were used from this point out per colon SSI prevention protocol.  I closed the 5mm port sites using Monocryl stitch and sterile dressing.  I closed the Pfannenstiel wound using a 0 Vicryl vertical peritoneal closure and a #1 PDS transverse anterior rectal fascial closure. I closed the skin with some interrupted Monocryl stitches. I placed  antibiotic-soaked wicks into the closure at the corners & centrally x4 between those areas. I placed a sterile dressing.    Patient is being extubated go to recovery room. I had discussed postop care with the patient in detail the office & in the holding area. Instructions are written. I updated the status of the patient to the patient's daughter & granddaughter.  I made recommendations.  I answered questions.  Understanding & appreciation was expressed.   [name], M.D., F.A.C.S. Gastrointestinal and Minimally Invasive Surgery [facility] [address], [address] [place], [place] [address] [phone] Main / Paging

## 2014-12-04 NOTE — Progress Notes (Signed)
Pt had episode of incontinent "diarrhea". Nurse assessed. Looked to be the last of the bowel prep and was clear in color. No stool noted. Pt stated he "wasn't sure" if he passed gas or not. Will continue to monitor.

## 2014-12-04 NOTE — Discharge Instructions (Signed)
ABDOMINAL SURGERY: POST OP INSTRUCTIONS ° °1. DIET: Follow a light bland diet the first 24 hours after arrival home, such as soup, liquids, crackers, etc.  Be sure to include lots of fluids daily.  Avoid fast food or heavy meals as your are more likely to get nauseated.  Eat a low fat the next few days after surgery.   °2. Take your usually prescribed home medications unless otherwise directed. °3. PAIN CONTROL: °a. Pain is best controlled by a usual combination of three different methods TOGETHER: °i. Ice/Heat °ii. Over the counter pain medication °iii. Prescription pain medication °b. Most patients will experience some swelling and bruising around the incisions.  Ice packs or heating pads (30-60 minutes up to 6 times a day) will help. Use ice for the first few days to help decrease swelling and bruising, then switch to heat to help relax tight/sore spots and speed recovery.  Some people prefer to use ice alone, heat alone, alternating between ice & heat.  Experiment to what works for you.  Swelling and bruising can take several weeks to resolve.   °c. It is helpful to take an over-the-counter pain medication regularly for the first few weeks.  Choose one of the following that works best for you: °i. Naproxen (Aleve, etc)  Two 220mg tabs twice a day °ii. Ibuprofen (Advil, etc) Three 200mg tabs four times a day (every meal & bedtime) °iii. Acetaminophen (Tylenol, etc) 500-650mg four times a day (every meal & bedtime) °d. A  prescription for pain medication (such as oxycodone, hydrocodone, etc) should be given to you upon discharge.  Take your pain medication as prescribed.  °i. If you are having problems/concerns with the prescription medicine (does not control pain, nausea, vomiting, rash, itching, etc), please call us [phone] to see if we need to switch you to a different pain medicine that will work better for you and/or control your side effect better. °ii. If you need a refill on your pain medication,  please contact your pharmacy.  They will contact our office to request authorization. Prescriptions will not be filled after 5 pm or on week-ends. °4. Avoid getting constipated.  Between the surgery and the pain medications, it is common to experience some constipation.  Increasing fluid intake and taking a fiber supplement (such as Metamucil, Citrucel, FiberCon, MiraLax, etc) 1-2 times a day regularly will usually help prevent this problem from occurring.  A mild laxative (prune juice, Milk of Magnesia, MiraLax, etc) should be taken according to package directions if there are no bowel movements after 48 hours.   °5. Watch out for diarrhea.  If you have many loose bowel movements, simplify your diet to bland foods & liquids for a few days.  Stop any stool softeners and decrease your fiber supplement.  Switching to mild anti-diarrheal medications (Kayopectate, Pepto Bismol) can help.  If this worsens or does not improve, please call us. °6. Wash / shower every day.  You may shower over the incision / wound.  Avoid baths until the skin is fully healed.  Continue to shower over incision(s) after the dressing is off. °7. Remove your waterproof bandages 5 days after surgery.  You may leave the incision open to air.  Remove any wicks or ribbons in your wound.  If you have an open wound, please see wound care instructions. You may replace a dressing/Band-Aid to cover the incision for comfort if you wish. °8. ACTIVITIES as tolerated:   °a. You may resume regular (light)   daily activities beginning the next day--such as daily self-care, walking, climbing stairs--gradually increasing activities as tolerated.  If you can walk 30 minutes without difficulty, it is safe to try more intense activity such as jogging, treadmill, bicycling, low-impact aerobics, swimming, etc. °b. Save the most intensive and strenuous activity for last such as sit-ups, heavy lifting, contact sports, etc  Refrain from any heavy lifting or straining  until you are off narcotics for pain control.   °c. DO NOT PUSH THROUGH PAIN.  Let pain be your guide: If it hurts to do something, don't do it.  Pain is your body warning you to avoid that activity for another week until the pain goes down. °d. You may drive when you are no longer taking prescription pain medication, you can comfortably wear a seatbelt, and you can safely maneuver your car and apply brakes. °e. You may have sexual intercourse when it is comfortable.  °9. FOLLOW UP in our office °a. Please call [facility] at [phone] to set up an appointment to see your surgeon in the office for a follow-up appointment approximately 1-2 weeks after your surgery. °b. Make sure that you call for this appointment the day you arrive home to insure a convenient appointment time. °10. IF YOU HAVE DISABILITY OR FAMILY LEAVE FORMS, BRING THEM TO THE OFFICE FOR PROCESSING.  DO NOT GIVE THEM TO YOUR DOCTOR. ° ° °WHEN TO CALL US [phone]: °1. Poor pain control °2. Reactions / problems with new medications (rash/itching, nausea, etc)  °3. Fever over 101.5 F (38.5 C) °4. Inability to urinate °5. Nausea and/or vomiting °6. Worsening swelling or bruising °7. Continued bleeding from incision. °8. Increased pain, redness, or drainage from the incision ° °The clinic staff is available to answer your questions during regular business hours (8:30am-5pm).  Please don’t hesitate to call and ask to speak to one of our nurses for clinical concerns.   A surgeon from [facility] is always on call at the hospitals °  °If you have a medical emergency, go to the nearest emergency room or call 911. °  ° °[facility] °[address], [address], [place], [place]  [address] ? °MAIN: [phone] ? TOLL FREE: [phone] ? °FAX [phone] °[email or web address] ° °GETTING TO GOOD BOWEL HEALTH. °Irregular bowel habits such as constipation and diarrhea can lead to many problems over time.   Having one soft bowel movement a day is the most important way to prevent further problems.  The anorectal canal is designed to handle stretching and feces to safely manage our ability to get rid of solid waste (feces, poop, stool) out of our body.  BUT, hard constipated stools can act like ripping concrete bricks and diarrhea can be a burning fire to this very sensitive area of our body, causing inflamed hemorrhoids, anal fissures, increasing risk is perirectal abscesses, abdominal pain/bloating, an making irritable bowel worse.     °The goal: ONE SOFT BOWEL MOVEMENT A DAY!  To have soft, regular bowel movements:  °• Drink at least 8 tall glasses of water a day.   °• Take plenty of fiber.  Fiber is the undigested part of plant food that passes into the colon, acting s “natures broom” to encourage bowel motility and movement.  Fiber can absorb and hold large amounts of water. This results in a larger, bulkier stool, which is soft and easier to pass. Work gradually over several weeks up to 6 servings a day of fiber (  25g a day even more if needed) in the form of: °o Vegetables -- Root (potatoes, carrots, turnips), leafy green (lettuce, salad greens, celery, spinach), or cooked high residue (cabbage, broccoli, etc) °o Fruit -- Fresh (unpeeled skin & pulp), Dried (prunes, apricots, cherries, etc ),  or stewed ( applesauce)  °o Whole grain breads, pasta, etc (whole wheat)  °o Bran cereals  °• Bulking Agents -- This type of water-retaining fiber generally is easily obtained each day by one of the following:  °o Psyllium bran -- The psyllium plant is remarkable because its ground seeds can retain so much water. This product is available as Metamucil, Konsyl, Effersyllium, Per Diem Fiber, or the less expensive generic preparation in drug and health food stores. Although labeled a laxative, it really is not a laxative.  °o Methylcellulose -- This is another fiber derived from wood which also retains water. It is available as  Citrucel. °o Polyethylene Glycol - and “artificial” fiber commonly called Miralax or Glycolax.  It is helpful for people with gassy or bloated feelings with regular fiber °o Flax Seed - a less gassy fiber than psyllium °• No reading or other relaxing activity while on the toilet. If bowel movements take longer than 5 minutes, you are too constipated °• AVOID CONSTIPATION.  High fiber and water intake usually takes care of this.  Sometimes a laxative is needed to stimulate more frequent bowel movements, but  °• Laxatives are not a good long-term solution as it can wear the colon out. °o Osmotics (Milk of Magnesia, Fleets phosphosoda, Magnesium citrate, MiraLax, GoLytely) are safer than  °o Stimulants (Senokot, Castor Oil, Dulcolax, Ex Lax)    °o Do not take laxatives for more than 7days in a row. °•  IF SEVERELY CONSTIPATED, try a Bowel Retraining Program: °o Do not use laxatives.  °o Eat a diet high in roughage, such as bran cereals and leafy vegetables.  °o Drink six (6) ounces of prune or apricot juice each morning.  °o Eat two (2) large servings of stewed fruit each day.  °o Take one (1) heaping tablespoon of a psyllium-based bulking agent twice a day. Use sugar-free sweetener when possible to avoid excessive calories.  °o Eat a normal breakfast.  °o Set aside 15 minutes after breakfast to sit on the toilet, but do not strain to have a bowel movement.  °o If you do not have a bowel movement by the third day, use an enema and repeat the above steps.  °• Controlling diarrhea °o Switch to liquids and simpler foods for a few days to avoid stressing your intestines further. °o Avoid dairy products (especially milk & ice cream) for a short time.  The intestines often can lose the ability to digest lactose when stressed. °o Avoid foods that cause gassiness or bloating.  Typical foods include beans and other legumes, cabbage, broccoli, and dairy foods.  Every person has some sensitivity to other foods, so listen to our  body and avoid those foods that trigger problems for you. °o Adding fiber (Citrucel, Metamucil, psyllium, Miralax) gradually can help thicken stools by absorbing excess fluid and retrain the intestines to act more normally.  Slowly increase the dose over a few weeks.  Too much fiber too soon can backfire and cause cramping & bloating. °o Probiotics (such as active yogurt, Align, etc) may help repopulate the intestines and colon with normal bacteria and calm down a sensitive digestive tract.  Most studies show it to be of mild help,   though, and such products can be costly. °o Medicines: °- Bismuth subsalicylate (ex. Kayopectate, Pepto Bismol) every 30 minutes for up to 6 doses can help control diarrhea.  Avoid if pregnant. °- Loperamide (Immodium) can slow down diarrhea.  Start with two tablets (4mg total) first and then try one tablet every 6 hours.  Avoid if you are having fevers or severe pain.  If you are not better or start feeling worse, stop all medicines and call your doctor for advice °o Call your doctor if you are getting worse or not better.  Sometimes further testing (cultures, endoscopy, X-ray studies, bloodwork, etc) may be needed to help diagnose and treat the cause of the diarrhea. ° °Managing Pain ° °Pain after surgery or related to activity is often due to strain/injury to muscle, tendon, nerves and/or incisions.  This pain is usually short-term and will improve in a few months.  ° °Many people find it helpful to do the following things TOGETHER to help speed the process of healing and to get back to regular activity more quickly: ° °1. Avoid heavy physical activity °a.  no lifting greater than 20 pounds °b. Do not “push through” the pain.  Listen to your body and avoid positions and maneuvers than reproduce the pain °c. Walking is okay as tolerated, but go slowly and stop when getting sore.  °d. Remember: If it hurts to do it, then don’t do it! °2. Take Anti-inflammatory medication  °a. Take with  food/snack around the clock for 1-2 weeks °i. This helps the muscle and nerve tissues become less irritable and calm down faster °b. Choose ONE of the following over-the-counter medications: °i. Naproxen 220mg tabs (ex. Aleve) 1-2 pills twice a day  °ii. Ibuprofen 200mg tabs (ex. Advil, Motrin) 3-4 pills with every meal and just before bedtime °iii. Acetaminophen 500mg tabs (Tylenol) 1-2 pills with every meal and just before bedtime °3. Use a Heating pad or Ice/Cold Pack °a. 4-6 times a day °b. May use warm bath/hottub  or showers °4. Try Gentle Massage and/or Stretching  °a. at the area of pain many times a day °b. stop if you feel pain - do not overdo it ° °Try these steps together to help you body heal faster and avoid making things get worse.  Doing just one of these things may not be enough.   ° °If you are not getting better after two weeks or are noticing you are getting worse, contact our office for further advice; we may need to re-evaluate you & see what other things we can do to help. ° °Laparoscopic Colectomy °Laparoscopic colectomy is surgery to remove part or all of the large intestine (colon). This procedure is used to treat several conditions, including: °· Inflammation and infection of the colon (diverticulitis). °· Tumors or masses in the colon. °· Inflammatory bowel disease, such as Crohn disease or ulcerative colitis. Colectomy is an option when symptoms cannot be controlled with medicines. °· Bleeding from the colon that cannot be controlled by another method. °· Blockage or obstruction of the colon. °LET YOUR HEALTH CARE PROVIDER KNOW ABOUT: °· Any allergies you have. °· All medicines you are taking, including vitamins, herbs, eye drops, creams, and over-the-counter medicines. °· Previous problems you or members of your family have had with the use of anesthetics. °· Any blood disorders you have. °· Previous surgeries you have had. °· Medical conditions you have. °RISKS AND  COMPLICATIONS °Generally, this is a safe procedure. However, as with any   procedure, complications can occur. Possible complications include: °· Infection. °· Bleeding. °· Damage to other organs. °· Leaking from where the colon was sewn together. °· Future blockage of the small intestines from scar tissue. Another surgery may be needed to repair this. °In some cases, complications such as damage to other organs or excessive bleeding may require the surgeon to convert from a laparoscopic procedure to an open procedure. This involves making a larger incision in the abdomen to perform the procedure. °BEFORE THE PROCEDURE °· Ask your health care provider about changing or stopping any regular medicines. °· You may be prescribed an oral bowel prep. This involves drinking a large amount of medicated liquid, starting the day before your surgery. The liquid will cause you to have multiple loose stools until your stool is almost clear or light green. This cleans out your colon in preparation for the surgery. °· Do not eat or drink anything else once you have started the bowel prep, unless your health care provider tells you it is safe to do so. °· You may also be given antibiotic pills to clean out your colon of bacteria. Be sure to follow the directions carefully and take the medicine at the correct time. °PROCEDURE  °· Small monitors will be put on your body. They are used to check your heart, blood pressure, and oxygen level. °· An IV access tube will be put into one of your veins. Medicine will be able to flow directly into your body through this IV tube. °· You might be given a medicine to help you relax (sedative). °· You will be given a medicine to make you sleep through the procedure (general anesthetic). A breathing tube may be placed into your lungs during the procedure. °· A thin, flexible tube (catheter) will be placed into your bladder to collect urine. °· A tube may be put in through your nose. It is called a  nasogastric tube. It is used to remove stomach fluids after surgery until the intestines start working again. °· Your abdomen will be filled with air so that it expands. This gives the surgeon more room to operate and makes your organs easier to see. °· Several small cuts (incisions) are made in your abdomen. °· A thin, lighted tube with a tiny camera on the end (laparoscope) is put through one of the small incisions. The camera on the laparoscope sends a picture to a TV screen in the operating room. This gives the surgeon a good view inside your abdomen. °· Hollow tubes are put through the other small incisions in your abdomen. The tools needed for the procedure are put through these tubes. °· Clamps or staples are put on both ends of the diseased part of the colon. °· The part of the intestine between the clamps or staples is removed. °· If possible, the ends of the healthy colon that remain will be stitched or stapled together to allow your body to expel waste (stool). °· Sometimes, the remaining colon cannot be stitched back together. If this is the case, a colostomy is needed. For a colostomy: °¨ An opening (stoma) to the outside of your body is made through the abdomen. °¨ The end of the colon is brought to the opening. It is stitched to the skin. °¨ A bag is attached to the opening. Stool will drain into this bag. The bag is removable. °¨ The colostomy can be temporary or permanent. °· The incisions from the colectomy are closed with stitches or   staples. °AFTER THE PROCEDURE °· You will be monitored closely in a recovery area until you are stable and doing well. You will then be moved to a regular hospital room. °· You will need to receive fluids through an IV tube until your bowel function has returned. This may take 1-3 days. Once your bowels are working again, you will be started on clear liquids and then advanced to solid food as tolerated. °· You will be given pain medicines to control your  pain. °Document Released: 12/17/2002 Document Revised: 07/17/2013 Document Reviewed: 05/08/2013 °[employer]® Patient Information ©2015 [employer]. This information is not intended to replace advice given to you by your health care provider. Make sure you discuss any questions you have with your health care provider. ° °

## 2014-12-04 NOTE — Transfer of Care (Signed)
Immediate Anesthesia Transfer of Care Note  Patient: Jack Sims  Procedure(s) Performed: Procedure(s): XI ROBOT ASSISTED SIGMOID COLECTOMY FOR SIGMOID DIVERTICULITIS (N/A) RIGID PROCTOSCOPY (N/A)  Patient Location: PACU  Anesthesia Type:General  Level of Consciousness: awake, alert  and oriented  Airway & Oxygen Therapy: Patient Spontanous Breathing and Patient connected to face mask oxygen  Post-op Assessment: Report given to RN  Post vital signs: Reviewed and stable  Last Vitals:  Filed Vitals:   12/04/14 0533  BP: 139/91  Pulse: 60  Temp: 36.4 C  Resp: 19    Complications: No apparent anesthesia complications

## 2014-12-04 NOTE — Anesthesia Procedure Notes (Signed)
Procedure Name: Intubation Date/Time: 12/04/2014 7:51 AM Performed by: Carleen Rhue, Nuala AlphaKRISTOPHER Pre-anesthesia Checklist: Patient identified, Emergency Drugs available, Suction available, Patient being monitored and Timeout performed Patient Re-evaluated:Patient Re-evaluated prior to inductionOxygen Delivery Method: Circle system utilized Preoxygenation: Pre-oxygenation with 100% oxygen Intubation Type: IV induction Ventilation: Mask ventilation without difficulty Laryngoscope Size: Mac and 4 Grade View: Grade II Tube type: Oral Tube size: 7.5 mm Number of attempts: 1 Airway Equipment and Method: Stylet Placement Confirmation: ETT inserted through vocal cords under direct vision,  positive ETCO2,  CO2 detector and breath sounds checked- equal and bilateral Secured at: 23 cm Tube secured with: Tape Dental Injury: Teeth and Oropharynx as per pre-operative assessment

## 2014-12-04 NOTE — H&P (Signed)
Jack Sims  Location: [facility] Patient #: [id number] DOB: [birth year] Divorced / Language: Undefined / Race: Undefined Male  History of Present Illness Jack Sportsman MD; 09/23/2014 12:47 PM) Patient words: recurrent diverticulitis.  The patient is a 54 year old male who presents with diverticulitis. Patient sent to me by gastroenterology for concern of recurrent diverticulitis. Followed by Jack Sims and Jack Sims Patient claims to have had episodes of left flank and lower quadrant pain for the past 15 years. Told it was diverticulitis about 10 years ago. He's had repeated attacks. Usually switches to a liquid diet and takes laxatives to help ride it out. He's needed antibiotics at least 3 times. He's had more frequent and intense attacks this year. 3 attacks in the last 6 months. His gastroenterologist in [place], [place] retired. Switched to gastroenterology in [place]. Had a more intense attack. Started on antibiotics. CT scan showed some thickening of the sigmoid colon. A little atypical. Patient due to get flexible sigmoidoscopy to rule out mass or tumor. Had a normal colonoscopy in 2011 by his gastroenterologist up in [place] before that gastroenterologist retired. Patient had open right inguinal hernia repair and cholecystectomy done laparoscopically up in [place]. Patient normally has 1-2 bowel movements a day. He does get some cramping and fullness in his left upper abdomen. He feels like he gets backed up and constipated. He does smoke a pack a day. Denies any major infections. He can walk a few miles without much difficulty his lungs he goes at a slow and even pace. He is frustrated and wishes to have something more aggressive done to stop these episodes of diverticulitis. Wished to see a Careers adviser. This was set up by his gastroenterologist. He has mild chronic left flank pain now. Off antibiotics. He has noted some blood in his  stool since June.   Other Problems Kerrie Buffalo, CMA; 09/23/2014 10:03 AM) Arthritis Back Pain Cholelithiasis Diverticulosis Hemorrhoids High blood pressure Hypercholesterolemia Inguinal Hernia  Past Surgical History Kerrie Buffalo, CMA; 09/23/2014 10:03 AM) Colon Polyp Removal - Colonoscopy Gallbladder Surgery - Laparoscopic Open Inguinal Hernia Surgery Right. Tonsillectomy  Diagnostic Studies History Kerrie Buffalo, CMA; 09/23/2014 10:03 AM) Colonoscopy 1-5 years ago  Allergies Kerrie Buffalo, CMA; 09/23/2014 10:04 AM) Sudafed *NASAL AGENTS - SYSTEMIC AND TOPICAL*  Medication History Kerrie Buffalo, CMA; 09/23/2014 10:05 AM) Ciprofloxacin HCl (500MG  Tablet, Oral) Active. Enalapril-Hydrochlorothiazide (5-12.5MG  Tablet, Oral) Active. MetroNIDAZOLE (250MG  Tablet, Oral) Active.  Social History Kerrie Buffalo, CMA; 09/23/2014 10:03 AM) Alcohol use Occasional alcohol use. Caffeine use Coffee, Tea. No drug use Tobacco use Current every day smoker.  Family History Kerrie Buffalo, CMA; 09/23/2014 10:03 AM) Alcohol Abuse Brother, Father. Colon Polyps Father. Diabetes Mellitus Mother. Heart Disease Mother. Heart disease in male family member before age 78 Malignant Neoplasm Of Pancreas Mother. Melanoma Father. Migraine Headache Mother.  Review of Systems Kerrie Buffalo CMA; 09/23/2014 10:03 AM) General Not Present- Appetite Loss, Chills, Fatigue, Fever, Night Sweats, Weight Gain and Weight Loss. Skin Not Present- Change in Wart/Mole, Dryness, Hives, Jaundice, New Lesions, Non-Healing Wounds, Rash and Ulcer. Respiratory Present- Snoring. Not Present- Bloody sputum, Chronic Cough, Difficulty Breathing and Wheezing. Breast Not Present- Breast Mass, Breast Pain, Nipple Discharge and Skin Changes. Cardiovascular Present- Chest Pain. Not Present- Difficulty Breathing Lying Down, Leg Cramps, Palpitations, Rapid Heart Rate, Shortness  of Breath and Swelling of Extremities. Gastrointestinal Present- Abdominal Pain, Bloating, Bloody Stool, Excessive gas and Gets full quickly at meals. Not Present- Change in Bowel Habits, Chronic diarrhea,  Constipation, Difficulty Swallowing, Hemorrhoids, Indigestion, Nausea, Rectal Pain and Vomiting. Male Genitourinary Not Present- Blood in Urine, Change in Urinary Stream, Frequency, Impotence, Nocturia, Painful Urination, Urgency and Urine Leakage.   Vitals Kerrie Buffalo CMA; 09/23/2014 10:05 AM) 09/23/2014 10:05 AM Weight: 240 lb Height: 72in Body Surface Area: 2.35 m Body Mass Index: 32.55 kg/m Temp.: 49F  Pulse: 79 (Regular)  BP: 140/80 (Sitting, Left Arm, Standard)    Physical Exam Jack Sportsman MD; 09/23/2014 12:47 PM) General Mental Status-Alert. General Appearance-Not in acute distress, Not Sickly. Orientation-Oriented X3. Hydration-Well hydrated. Voice-Normal.  Integumentary Global Assessment Upon inspection and palpation of skin surfaces of the - Axillae: non-tender, no inflammation or ulceration, no drainage. and Distribution of scalp and body hair is normal. General Characteristics Temperature - normal warmth is noted.  Head and Neck Head-normocephalic, atraumatic with no lesions or palpable masses. Face Global Assessment - atraumatic, no absence of expression. Neck Global Assessment - no abnormal movements, no bruit auscultated on the right, no bruit auscultated on the left, no decreased range of motion, non-tender. Trachea-midline. Thyroid Gland Characteristics - non-tender.  Eye Eyeball - Left-Extraocular movements intact, No Nystagmus. Eyeball - Right-Extraocular movements intact, No Nystagmus. Cornea - Left-No Hazy. Cornea - Right-No Hazy. Sclera/Conjunctiva - Left-No scleral icterus, No Discharge. Sclera/Conjunctiva - Right-No scleral icterus, No Discharge. Pupil - Left-Direct reaction to light  normal. Pupil - Right-Direct reaction to light normal.  ENMT Ears Pinna - Left - no drainage observed, no generalized tenderness observed. Right - no drainage observed, no generalized tenderness observed. Nose and Sinuses External Inspection of the Nose - no destructive lesion observed. Inspection of the nares - Left - quiet respiration. Right - quiet respiration. Mouth and Throat Lips - Upper Lip - no fissures observed, no pallor noted. Lower Lip - no fissures observed, no pallor noted. Nasopharynx - no discharge present. Oral Cavity/Oropharynx - Tongue - no dryness observed. Oral Mucosa - no cyanosis observed. Hypopharynx - no evidence of airway distress observed.  Chest and Lung Exam Inspection Movements - Normal and Symmetrical. Accessory muscles - No use of accessory muscles in breathing. Palpation Palpation of the chest reveals - Non-tender. Auscultation Breath sounds - Normal and Clear.  Cardiovascular Auscultation Rhythm - Regular. Murmurs & Other Heart Sounds - Auscultation of the heart reveals - No Murmurs and No Systolic Clicks.  Abdomen Inspection Inspection of the abdomen reveals - No Visible peristalsis and No Abnormal pulsations. Umbilicus - No Bleeding, No Urine drainage. Palpation/Percussion Palpation and Percussion of the abdomen reveal - Soft, Non Tender, No Rebound tenderness, No Rigidity (guarding) and No Cutaneous hyperesthesia. Note: Mild left flank and lower quadrant tenderness to palpation. Mild left upper quadrant pain. No peritonitis or guarding. Obese but soft.   Male Genitourinary Sexual Maturity Tanner 5 - Adult hair pattern and Adult penile size and shape.  Peripheral Vascular Upper Extremity Inspection - Left - No Cyanotic nailbeds, Not Ischemic. Right - No Cyanotic nailbeds, Not Ischemic.  Neurologic Neurologic evaluation reveals -normal attention span and ability to concentrate, able to name objects and repeat phrases. Appropriate fund of  knowledge , normal sensation and normal coordination. Mental Status Affect - not angry, not paranoid. Cranial Nerves-Normal Bilaterally. Gait-Normal.  Neuropsychiatric Mental status exam performed with findings of-able to articulate well with normal speech/language, rate, volume and coherence, thought content normal with ability to perform basic computations and apply abstract reasoning and no evidence of hallucinations, delusions, obsessions or homicidal/suicidal ideation.  Musculoskeletal Global Assessment Spine, Ribs and Pelvis - no  instability, subluxation or laxity. Right Upper Extremity - no instability, subluxation or laxity.  Lymphatic Head & Neck  General Head & Neck Lymphatics: Bilateral - Description - No Localized lymphadenopathy. Axillary  General Axillary Region: Bilateral - Description - No Localized lymphadenopathy. Femoral & Inguinal  Generalized Femoral & Inguinal Lymphatics: Left - Description - No Localized lymphadenopathy. Right - Description - No Localized lymphadenopathy.    Assessment & Plan   DIVERTICULITIS OF RECTOSIGMOID (562.11  K57.32) Impression: His story is very classic for recurrent diverticulitis. Attacks for the past 15 years. Increasing in intensity and frequency.  Given the eccentric thickening and hematochezia for the past few months, I strongly agree with Dr. Marzetta Board suggestion of flexible sigmoidoscopy to rule out cancer or tumor. Hopefully not likely given normal colonoscopy 4 years before. - Sig c/w divertiulosis/itis  I think he would benefit from sigmoid resection of the chronic diverticulitis/stricture. Reasonable minimally invasive approach. Robotic if available versus laparoscopic. Reasonable operative risk.  His biggest risk is his smoking. I tried to strongly suggest that he quit.  He is very interested in surgery as a more definitive solution to the problem. I asked that he call me once the flexible sigmoidoscopy is done  so we can have a plan. Current Plans  Schedule for Surgery Instructions:  STOP SMOKING!  We strongly recommend that you stop smoking. Smoking increases the risk of surgery including infection in the form of an open wound, pus formation, abscess, hernia at an incision on the abdomen, etc. You have an increased risk of other MAJOR complications such as stroke, heart attack, forming clots in the leg and/or lungs, and death.  Smoking Cessation Quitting smoking is important to your health and has many advantages. However, it is not always easy to quit since nicotine is a very addictive drug. Often times, people try 3 times or more before being able to quit. This document explains the best ways for you to prepare to quit smoking. Quitting takes hard work and a lot of effort, but you can do it. ADVANTAGES OF QUITTING SMOKING  You will live longer, feel better, and live better.  Your body will feel the impact of quitting smoking almost immediately.  Within 20 minutes, blood pressure decreases. Your pulse returns to its normal level.  After 8 hours, carbon monoxide levels in the blood return to normal. Your oxygen level increases.  After 24 hours, the chance of having a heart attack starts to decrease. Your breath, hair, and body stop smelling like smoke.  After 48 hours, damaged nerve endings begin to recover. Your sense of taste and smell improve.  After 72 hours, the body is virtually free of nicotine. Your bronchial tubes relax and breathing becomes easier.  After 2 to 12 weeks, lungs can hold more air. Exercise becomes easier and circulation improves.  The risk of having a heart attack, stroke, cancer, or lung disease is greatly reduced.  After 1 year, the risk of coronary heart disease is cut in half.  After 5 years, the risk of stroke falls to the same as a nonsmoker.  After 10 years, the risk of lung cancer is cut in half and the risk of other cancers decreases significantly.   After 15 years, the risk of coronary heart disease drops, usually to the level of a nonsmoker.  If you are pregnant, quitting smoking will improve your chances of having a healthy baby.  The people you live with, especially any children, will be healthier.  You will  have extra money to spend on things other than cigarettes. QUESTIONS TO THINK ABOUT BEFORE ATTEMPTING TO QUIT You may want to talk about your answers with your caregiver.  Why do you want to quit?  If you tried to quit in the past, what helped and what did not?  What will be the most difficult situations for you after you quit? How will you plan to handle them?  Who can help you through the tough times? Your family? Friends? A caregiver?  What pleasures do you get from smoking? What ways can you still get pleasure if you quit? Here are some questions to ask your caregiver:  How can you help me to be successful at quitting?  What medicine do you think would be best for me and how should I take it?  What should I do if I need more help?  What is smoking withdrawal like? How can I get information on withdrawal? GET READY  Set a quit date.  Change your environment by getting rid of all cigarettes, ashtrays, matches, and lighters in your home, car, or work. Do not let people smoke in your home.  Review your past attempts to quit. Think about what worked and what did not. GET SUPPORT AND ENCOURAGEMENT You have a better chance of being successful if you have help. You can get support in many ways.  Tell your family, friends, and co-workers that you are going to quit and need their support. Ask them not to smoke around you.  Get individual, group, or telephone counseling and support. Programs are available at [facility] and health centers. Call your local health department for information about programs in your area.  Spiritual beliefs and practices may help some smokers quit.  Download a "quit meter" on your  computer to keep track of quit statistics, such as how long you have gone without smoking, cigarettes not smoked, and money saved.  Get a self-help book about quitting smoking and staying off of tobacco. LEARN NEW SKILLS AND BEHAVIORS  Distract yourself from urges to smoke. Talk to someone, go for a walk, or occupy your time with a task.  Change your normal routine. Take a different route to work. Drink tea instead of coffee. Eat breakfast in a different place.  Reduce your stress. Take a hot bath, exercise, or read a book.  Plan something enjoyable to do every day. Reward yourself for not smoking.  Explore interactive web-based programs that specialize in helping you quit. GET MEDICINE AND USE IT CORRECTLY Medicines can help you stop smoking and decrease the urge to smoke. Combining medicine with the above behavioral methods and support can greatly increase your chances of successfully quitting smoking.  Nicotine replacement therapy helps deliver nicotine to your body without the negative effects and risks of smoking. Nicotine replacement therapy includes nicotine gum, lozenges, inhalers, nasal sprays, and skin patches. Some may be available over-the-counter and others require a prescription.  Antidepressant medicine helps people abstain from smoking, but how this works is unknown. This medicine is available by prescription.  Nicotinic receptor partial agonist medicine simulates the effect of nicotine in your brain. This medicine is available by prescription. Ask your caregiver for advice about which medicines to use and how to use them based on your health history. Your caregiver will tell you what side effects to look out for if you choose to be on a medicine or therapy. Carefully read the information on the package. Do not use any other product containing  nicotine while using a nicotine replacement product. RELAPSE OR DIFFICULT SITUATIONS Most relapses occur within the first 3 months  after quitting. Do not be discouraged if you start smoking again. Remember, most people try several times before finally quitting. You may have symptoms of withdrawal because your body is used to nicotine. You may crave cigarettes, be irritable, feel very hungry, cough often, get headaches, or have difficulty concentrating. The withdrawal symptoms are only temporary. They are strongest when you first quit, but they will go away within 10 14 days. To reduce the chances of relapse, try to:  Avoid drinking alcohol. Drinking lowers your chances of successfully quitting.  Reduce the amount of caffeine you consume. Once you quit smoking, the amount of caffeine in your body increases and can give you symptoms, such as a rapid heartbeat, sweating, and anxiety.  Avoid smokers because they can make you want to smoke.  Do not let weight gain distract you. Many smokers will gain weight when they quit, usually less than 10 pounds. Eat a healthy diet and stay active. You can always lose the weight gained after you quit.  Find ways to improve your mood other than smoking. FOR MORE INFORMATION www.smokefree.gov   While it can be one of the most difficult things to do, the [place] community has programs to help you stop. Consider talking with your primary care physician about options. Also, Smoking Cessation classes are available through the [facility]:  The smoking cessation program is a proven-effective program from the American Lung Association. The program is available for anyone 82 and older who currently smokes. The program lasts for 7 weeks and is 8 sessions. Each class will be approximately 1 1/2 hours. The program is every Tuesday. All classes are 12-1:30pm and same location.  Event Location Information: Location: [facility] 2nd Floor Conference Room 2-037; located next to [facility] cross streets: [address] [facility] Entrance into the [facility] is adjacent to the [facility] main entrance. The conference room is located on the 2nd floor. Parking Instructions: Visitor parking is adjacent to [employer] main entrance and the Cancer Center   A smoking cessation program is also offered through the [facility]. Register online at MedicationWebsites.[email or web address] or call [phone] for more information. Tobacco cessation counseling is available at [facility]. Call [phone] for a free appointment. Tobacco cessation classes also are available through the [facility] in [place]. For information, call [phone]. The Patient Education Network features videos on tobacco cessation. Please consult your listings in the center of this book to find instructions on how to access this resource. If you want more information, ask your nurse.     Started Neomycin Sulfate , 2 (two) Tablet SEE NOTE, #6, 09/23/2014, No Refill. Local Order: TAKE TWO TABLETS AT 2 PM, 3 PM, AND 10 PM THE DAY PRIOR TO SURGERY Started Flagyl , 2 (two) Tablet SEE NOTE, #6, 09/23/2014, No Refill. Local Order: Take at 2pm, 3pm, and 10pm the day prior to your colon operation [facility] Consent - Colectomy (Jack Sims): discussed with patient and provided information. Pt Education - [facility] Bowel Prep   Signed by Jack Sportsman, MD   Jack Sims, M.D., F.A.C.S. Gastrointestinal and Minimally Invasive Surgery [facility] [address], [address] [place], [place] [address] [phone] Main / Paging

## 2014-12-05 ENCOUNTER — Encounter (HOSPITAL_COMMUNITY): Payer: Self-pay | Admitting: Surgery

## 2014-12-05 LAB — BASIC METABOLIC PANEL
ANION GAP: 8 (ref 5–15)
BUN: 18 mg/dL (ref 6–23)
CALCIUM: 8.5 mg/dL (ref 8.4–10.5)
CO2: 26 mmol/L (ref 19–32)
Chloride: 106 mmol/L (ref 96–112)
Creatinine, Ser: 1.04 mg/dL (ref 0.50–1.35)
GFR calc Af Amer: >90 mL/min (ref >90)
GFR calc non Af Amer: 81 mL/min — ABNORMAL LOW (ref >90)
Glucose, Bld: 150 mg/dL — ABNORMAL HIGH (ref 70–99)
Potassium: 4.1 mmol/L (ref 3.5–5.1)
Sodium: 140 mmol/L (ref 135–145)

## 2014-12-05 LAB — CBC
HEMATOCRIT: 40.1 % (ref 39.0–52.0)
Hemoglobin: 13.4 g/dL (ref 13.0–17.0)
MCH: 32.1 pg (ref 26.0–34.0)
MCHC: 33.4 g/dL (ref 30.0–36.0)
MCV: 96.2 fL (ref 78.0–100.0)
Platelets: 284 10*3/uL (ref 150–400)
RBC: 4.17 MIL/uL — ABNORMAL LOW (ref 4.22–5.81)
RDW: 12.7 % (ref 11.5–15.5)
WBC: 15.4 10*3/uL — AB (ref 4.0–10.5)

## 2014-12-05 LAB — MAGNESIUM: Magnesium: 1.9 mg/dL (ref 1.5–2.5)

## 2014-12-05 MED ORDER — NAPROXEN 500 MG PO TABS
500.0000 mg | ORAL_TABLET | Freq: Two times a day (BID) | ORAL | Status: DC
  Administered 2014-12-05 – 2014-12-07 (×5): 500 mg via ORAL
  Filled 2014-12-05 (×7): qty 1

## 2014-12-05 MED ORDER — SODIUM CHLORIDE 0.9 % IJ SOLN
3.0000 mL | Freq: Two times a day (BID) | INTRAMUSCULAR | Status: DC
  Administered 2014-12-05 – 2014-12-06 (×3): 3 mL via INTRAVENOUS

## 2014-12-05 MED ORDER — LACTATED RINGERS IV BOLUS (SEPSIS): 1000.0000 mL | Freq: Three times a day (TID) | INTRAVENOUS | Status: AC | PRN

## 2014-12-05 MED ORDER — ZOLPIDEM TARTRATE 10 MG PO TABS
10.0000 mg | ORAL_TABLET | Freq: Every evening | ORAL | Status: DC | PRN
  Administered 2014-12-05: 10 mg via ORAL
  Filled 2014-12-05: qty 1

## 2014-12-05 MED ORDER — SODIUM CHLORIDE 0.9 % IJ SOLN: 3.0000 mL | INTRAMUSCULAR | Status: DC | PRN

## 2014-12-05 MED ORDER — ACETAMINOPHEN 325 MG PO TABS: 325.0000 mg | ORAL_TABLET | Freq: Four times a day (QID) | ORAL | Status: DC | PRN

## 2014-12-05 MED ORDER — SODIUM CHLORIDE 0.9 % IV SOLN: 250.0000 mL | INTRAVENOUS | Status: DC | PRN

## 2014-12-05 NOTE — Progress Notes (Signed)
[facility]  [address] [place], [place] [address] Phone: [phone] FAX: [phone]    TAURUS ALAMO [id number] 07-16-1961  CARE TEAM:  PCP: Quentin Cornwall, MD  Outpatient Care Team: Patient Care Team: Quentin Cornwall, MD as PCP - General (Family Medicine) Michael Boston, MD as Consulting Physician (General Surgery) Inda Castle, MD as Consulting Physician (Gastroenterology)  Inpatient Treatment Team: Treatment Team: Attending Provider: Michael Boston, MD; Technician: Rudi Heap, NT   Subjective:  Walking in hallways Sore Tolerating clears Wife in room  Objective:  Vital signs:  Filed Vitals:   12/04/14 2211 12/05/14 0215 12/05/14 0500 12/05/14 0628  BP: 116/64 118/65  120/62  Pulse: 87 80  83  Temp: 97.7 F (36.5 C) 98.6 F (37 C)  98.1 F (36.7 C)  TempSrc: Oral Oral  Oral  Resp: $Remo'15 16  16  'JQHwg$ Height:      Weight:   252 lb 10.4 oz (114.6 kg)   SpO2: 97% 98%  100%    Last BM Date: 12/04/14  Intake/Output   Yesterday:  02/25 0701 - 02/26 0700 In: 3742.5 [P.O.:1080; I.V.:2662.5] Out: 2950 [Urine:2800; Blood:150] This shift:     Bowel function:  Flatus: y  BM: small liquid  Drain: n/a  Physical Exam:  General: Pt awake/alert/oriented x4 in no acute distress Eyes: PERRL, normal EOM.  Sclera clear.  No icterus Neuro: CN II-XII intact w/o focal sensory/motor deficits. Lymph: No head/neck/groin lymphadenopathy Psych:  No delerium/psychosis/paranoia HENT: Normocephalic, Mucus membranes moist.  No thrush Neck: Supple, No tracheal deviation Chest: No chest wall pain w good excursion CV:  Pulses intact.  Regular rhythm MS: Normal AROM mjr joints.  No obvious deformity Abdomen: Soft.  Nondistended.  Mildly tender at incisions only.  No evidence of peritonitis.  No incarcerated hernias. Ext:  SCDs BLE.  No mjr edema.  No cyanosis Skin: No petechiae / purpura   Problem List:   Principal  Problem:   Diverticulitis of colon Active Problems:   Hypertension   Diverticulitis of sigmoid colon   Assessment  Alveria Apley  54 y.o. male  1 Day Post-Op  Procedure(s): XI ROBOT ASSISTED SIGMOID COLECTOMY FOR SIGMOID DIVERTICULITIS RIGID PROCTOSCOPY  Recovering  Plan:  -adv diet gradually -min IVF w PRN IVF backup -f/u pathology -VTE prophylaxis- SCDs, etc -mobilize as tolerated to help recovery  I updated the status of the patient to the patient & his wife.  I made recommendations.  I answered questions.  Understanding & appreciation was expressed.   Adin Hector, M.D., F.A.C.S. Gastrointestinal and Minimally Invasive Surgery [facility] [address], [place], [place] [address] [phone] Main / Paging   12/05/2014   Results:   Labs: Results for orders placed or performed during the hospital encounter of 12/04/14 (from the past 48 hour(s))  Basic metabolic panel     Status: Abnormal   Collection Time: 12/05/14  5:37 AM  Result Value Ref Range   Sodium 140 135 - 145 mmol/L   Potassium 4.1 3.5 - 5.1 mmol/L   Chloride 106 96 - 112 mmol/L   CO2 26 19 - 32 mmol/L   Glucose, Bld 150 (H) 70 - 99 mg/dL   BUN 18 6 - 23 mg/dL   Creatinine, Ser 1.04 0.50 - 1.35 mg/dL   Calcium 8.5 8.4 - 10.5 mg/dL   GFR calc non Af Amer 81 (L) >90 mL/min   GFR calc Af Amer >  90 >90 mL/min    Comment: (NOTE) The eGFR has been calculated using the CKD EPI equation. This calculation has not been validated in all clinical situations. eGFR's persistently <90 mL/min signify possible Chronic Kidney Disease.    Anion gap 8 5 - 15  CBC     Status: Abnormal   Collection Time: 12/05/14  5:37 AM  Result Value Ref Range   WBC 15.4 (H) 4.0 - 10.5 K/uL   RBC 4.17 (L) 4.22 - 5.81 MIL/uL   Hemoglobin 13.4 13.0 - 17.0 g/dL   HCT 40.1 39.0 - 52.0 %   MCV 96.2 78.0 - 100.0 fL   MCH 32.1 26.0 - 34.0 pg   MCHC 33.4 30.0 - 36.0 g/dL   RDW 12.7 11.5 -  15.5 %   Platelets 284 150 - 400 K/uL  Magnesium     Status: None   Collection Time: 12/05/14  5:37 AM  Result Value Ref Range   Magnesium 1.9 1.5 - 2.5 mg/dL    Imaging / Studies: No results found.  Medications / Allergies: per chart  Antibiotics: Anti-infectives    Start     Dose/Rate Route Frequency Ordered Stop   12/04/14 2000  cefoTEtan (CEFOTAN) 2 g in dextrose 5 % 50 mL IVPB     2 g 100 mL/hr over 30 Minutes Intravenous Every 12 hours 12/04/14 1226 12/04/14 2247   12/04/14 1029  clindamycin (CLEOCIN) 900 mg, gentamicin (GARAMYCIN) 240 mg in sodium chloride 0.9 % 1,000 mL for intraperitoneal lavage  Status:  Discontinued       As needed 12/04/14 1029 12/04/14 1118   12/04/14 0600  clindamycin (CLEOCIN) 900 mg, gentamicin (GARAMYCIN) 240 mg in sodium chloride 0.9 % 1,000 mL for intraperitoneal lavage  Status:  Discontinued    Comments:  Pharmacy may adjust dosing strength, schedule, rate of infusion, etc as needed to optimize therapy    Intraperitoneal To Surgery 12/04/14 0531 12/04/14 1212   12/04/14 0531  cefoTEtan (CEFOTAN) 2 g in dextrose 5 % 50 mL IVPB     2 g 100 mL/hr over 30 Minutes Intravenous On call to O.R. 12/04/14 0531 12/04/14 0804   12/03/14 1447  clindamycin (CLEOCIN) IVPB 900 mg  Status:  Discontinued     900 mg 100 mL/hr over 30 Minutes Intravenous 60 min pre-op 12/03/14 1447 12/04/14 1212   12/03/14 1447  gentamicin (GARAMYCIN) 440 mg in dextrose 5 % 100 mL IVPB  Status:  Discontinued     440 mg 111 mL/hr over 60 Minutes Intravenous 60 min pre-op 12/03/14 1447 12/04/14 1212       Note: Portions of this report may have been transcribed using voice recognition software. Every effort was made to ensure accuracy; however, inadvertent computerized transcription errors may be present.   Any transcriptional errors that result from this process are unintentional.

## 2014-12-06 MED ORDER — FUROSEMIDE 40 MG PO TABS
40.0000 mg | ORAL_TABLET | Freq: Once | ORAL | Status: AC
  Administered 2014-12-06: 40 mg via ORAL
  Filled 2014-12-06: qty 1

## 2014-12-06 MED ORDER — FUROSEMIDE 40 MG PO TABS
40.0000 mg | ORAL_TABLET | Freq: Once | ORAL | Status: DC
  Filled 2014-12-06: qty 1

## 2014-12-06 NOTE — Progress Notes (Signed)
[facility]  [address] [place], [place] [address] Phone: [phone] FAX: [phone]    Jack Sims [id number] 10/25/1960  CARE TEAM:  PCP: Quentin Cornwall, MD  Outpatient Care Team: Patient Care Team: Quentin Cornwall, MD as PCP - General (Family Medicine) Michael Boston, MD as Consulting Physician (General Surgery) Inda Castle, MD as Consulting Physician (Gastroenterology)  Inpatient Treatment Team: Treatment Team: Attending Provider: Michael Boston, MD; Technician: Rudi Heap, NT; Registered Nurse: Mortimer Fries, RN   Subjective:  Walking in hallways Sore Tolerating clears Wife in room  Objective:  Vital signs:  Filed Vitals:   12/06/14 0000 12/06/14 0500 12/06/14 0539 12/06/14 0541  BP: 142/73  152/88   Pulse: 63  55   Temp:   98.2 F (36.8 C)   TempSrc:   Oral   Resp:   18   Height:      Weight:  256 lb 9.9 oz (116.4 kg)    SpO2:   88% 95%    Last BM Date: 12/05/14  Intake/Output   Yesterday:  02/26 0701 - 02/27 0700 In: 1020 [P.O.:340; I.V.:680] Out: 3075 [Urine:3075] This shift:     Bowel function:  Flatus: y  BM: small liquid  Drain: n/a  Physical Exam:  General: Pt awake/alert/oriented x4 in no acute distress Eyes: PERRL, normal EOM.  Sclera clear.  No icterus Neuro: CN II-XII intact w/o focal sensory/motor deficits. Lymph: No head/neck/groin lymphadenopathy Psych:  No delerium/psychosis/paranoia HENT: Normocephalic, Mucus membranes moist.  No thrush Neck: Supple, No tracheal deviation Chest: No chest wall pain w good excursion CV:  Pulses intact.  Regular rhythm MS: Normal AROM mjr joints.  No obvious deformity Abdomen: Soft.  Nondistended.  Mildly tender at incisions only.  No evidence of peritonitis.  No incarcerated hernias. Ext:  SCDs BLE.  No mjr edema.  No cyanosis Skin: No petechiae / purpura   Problem List:   Principal Problem:   Diverticulitis of sigmoid  colon s/p robotic colectomy 12/04/2014 Active Problems:   Hypertension   Assessment  Jack Sims  54 y.o. male  2 Days Post-Op  Procedure(s): XI ROBOT ASSISTED SIGMOID COLECTOMY FOR SIGMOID DIVERTICULITIS RIGID PROCTOSCOPY  Recovering  Plan:  -adv diet gradually -min IVF w PRN IVF backup -f/u pathology -VTE prophylaxis- SCDs, etc -mobilize as tolerated to help recovery  I updated the status of the patient to the patient & his wife.  I made recommendations.  I answered questions.  Understanding & appreciation was expressed.   Adin Hector, M.D., F.A.C.S. Gastrointestinal and Minimally Invasive Surgery [facility] [address], [place], [place] [address] [phone] Main / Paging   12/06/2014   Results:   Labs: Results for orders placed or performed during the hospital encounter of 12/04/14 (from the past 48 hour(s))  Basic metabolic panel     Status: Abnormal   Collection Time: 12/05/14  5:37 AM  Result Value Ref Range   Sodium 140 135 - 145 mmol/L   Potassium 4.1 3.5 - 5.1 mmol/L   Chloride 106 96 - 112 mmol/L   CO2 26 19 - 32 mmol/L   Glucose, Bld 150 (H) 70 - 99 mg/dL   BUN 18 6 - 23 mg/dL   Creatinine, Ser 1.04 0.50 - 1.35 mg/dL   Calcium 8.5 8.4 - 10.5 mg/dL   GFR calc non Af Amer 81 (L) >90 mL/min   GFR calc Af Amer >90 >90 mL/min  Comment: (NOTE) The eGFR has been calculated using the CKD EPI equation. This calculation has not been validated in all clinical situations. eGFR's persistently <90 mL/min signify possible Chronic Kidney Disease.    Anion gap 8 5 - 15  CBC     Status: Abnormal   Collection Time: 12/05/14  5:37 AM  Result Value Ref Range   WBC 15.4 (H) 4.0 - 10.5 K/uL   RBC 4.17 (L) 4.22 - 5.81 MIL/uL   Hemoglobin 13.4 13.0 - 17.0 g/dL   HCT 40.1 39.0 - 52.0 %   MCV 96.2 78.0 - 100.0 fL   MCH 32.1 26.0 - 34.0 pg   MCHC 33.4 30.0 - 36.0 g/dL   RDW 12.7 11.5 - 15.5 %   Platelets 284 150 - 400  K/uL  Magnesium     Status: None   Collection Time: 12/05/14  5:37 AM  Result Value Ref Range   Magnesium 1.9 1.5 - 2.5 mg/dL    Imaging / Studies: No results found.  Medications / Allergies: per chart  Antibiotics: Anti-infectives    Start     Dose/Rate Route Frequency Ordered Stop   12/04/14 2000  cefoTEtan (CEFOTAN) 2 g in dextrose 5 % 50 mL IVPB     2 g 100 mL/hr over 30 Minutes Intravenous Every 12 hours 12/04/14 1226 12/04/14 2247   12/04/14 1029  clindamycin (CLEOCIN) 900 mg, gentamicin (GARAMYCIN) 240 mg in sodium chloride 0.9 % 1,000 mL for intraperitoneal lavage  Status:  Discontinued       As needed 12/04/14 1029 12/04/14 1118   12/04/14 0600  clindamycin (CLEOCIN) 900 mg, gentamicin (GARAMYCIN) 240 mg in sodium chloride 0.9 % 1,000 mL for intraperitoneal lavage  Status:  Discontinued    Comments:  Pharmacy may adjust dosing strength, schedule, rate of infusion, etc as needed to optimize therapy    Intraperitoneal To Surgery 12/04/14 0531 12/04/14 1212   12/04/14 0531  cefoTEtan (CEFOTAN) 2 g in dextrose 5 % 50 mL IVPB     2 g 100 mL/hr over 30 Minutes Intravenous On call to O.R. 12/04/14 0531 12/04/14 0804   12/03/14 1447  clindamycin (CLEOCIN) IVPB 900 mg  Status:  Discontinued     900 mg 100 mL/hr over 30 Minutes Intravenous 60 min pre-op 12/03/14 1447 12/04/14 1212   12/03/14 1447  gentamicin (GARAMYCIN) 440 mg in dextrose 5 % 100 mL IVPB  Status:  Discontinued     440 mg 111 mL/hr over 60 Minutes Intravenous 60 min pre-op 12/03/14 1447 12/04/14 1212       Note: Portions of this report may have been transcribed using voice recognition software. Every effort was made to ensure accuracy; however, inadvertent computerized transcription errors may be present.   Any transcriptional errors that result from this process are unintentional.

## 2014-12-07 NOTE — Discharge Summary (Signed)
Physician Discharge Summary  Patient ID: Jack Sims MRN: [id number] DOB/AGE: 11/23/1960 54 y.o.  Admit date: 12/04/2014 Discharge date: 12/07/2014  Patient Care Team: Quentin Cornwall, MD as PCP - General (Family Medicine) Michael Boston, MD as Consulting Physician (General Surgery) Inda Castle, MD as Consulting Physician (Gastroenterology)  Admission Diagnoses: Principal Problem:   Diverticulitis of sigmoid colon s/p robotic colectomy 12/04/2014 Active Problems:   Hypertension   Discharge Diagnoses:  Principal Problem:   Diverticulitis of sigmoid colon s/p robotic colectomy 12/04/2014 Active Problems:   Hypertension   POST-OPERATIVE DIAGNOSIS:  Sigmoid Diverticulitis  SURGERY:  Procedure(s): XI ROBOT ASSISTED SIGMOID COLECTOMY FOR SIGMOID DIVERTICULITIS RIGID PROCTOSCOPY  SURGEON:  Surgeon(s): Michael Boston, MD Leighton Ruff, MD  Consults: None  Hospital Course:   The patient underwent the surgery above.  Postoperatively, the patient gradually mobilized and advanced to a solid diet.  Pain and other symptoms were treated aggressively.    By the time of discharge, the patient was walking well the hallways, eating food, having flatus.  Pain was well-controlled on an oral medications.  Based on meeting discharge criteria and continuing to recover, I felt it was safe for the patient to be discharged from the hospital to further recover with close followup. Postoperative recommendations were discussed in detail.  They are written as well.   Significant Diagnostic Studies:  Results for orders placed or performed during the hospital encounter of 12/04/14 (from the past 72 hour(s))  Basic metabolic panel     Status: Abnormal   Collection Time: 12/05/14  5:37 AM  Result Value Ref Range   Sodium 140 135 - 145 mmol/L   Potassium 4.1 3.5 - 5.1 mmol/L   Chloride 106 96 - 112 mmol/L   CO2 26 19 - 32 mmol/L   Glucose, Bld 150 (H) 70 - 99 mg/dL   BUN 18 6 - 23 mg/dL    Creatinine, Ser 1.04 0.50 - 1.35 mg/dL   Calcium 8.5 8.4 - 10.5 mg/dL   GFR calc non Af Amer 81 (L) >90 mL/min   GFR calc Af Amer >90 >90 mL/min    Comment: (NOTE) The eGFR has been calculated using the CKD EPI equation. This calculation has not been validated in all clinical situations. eGFR's persistently <90 mL/min signify possible Chronic Kidney Disease.    Anion gap 8 5 - 15  CBC     Status: Abnormal   Collection Time: 12/05/14  5:37 AM  Result Value Ref Range   WBC 15.4 (H) 4.0 - 10.5 K/uL   RBC 4.17 (L) 4.22 - 5.81 MIL/uL   Hemoglobin 13.4 13.0 - 17.0 g/dL   HCT 40.1 39.0 - 52.0 %   MCV 96.2 78.0 - 100.0 fL   MCH 32.1 26.0 - 34.0 pg   MCHC 33.4 30.0 - 36.0 g/dL   RDW 12.7 11.5 - 15.5 %   Platelets 284 150 - 400 K/uL  Magnesium     Status: None   Collection Time: 12/05/14  5:37 AM  Result Value Ref Range   Magnesium 1.9 1.5 - 2.5 mg/dL    No results found.  Discharge Exam: Blood pressure 161/88, pulse 56, temperature 97.7 F (36.5 C), temperature source Oral, resp. rate 18, height 6' (1.829 m), weight 237 lb 1.6 oz (107.548 kg), SpO2 96 %.  General: Pt awake/alert/oriented x4 in no major acute distress Eyes: PERRL, normal EOM. Sclera nonicteric Neuro: CN II-XII intact w/o focal sensory/motor deficits. Lymph: No head/neck/groin lymphadenopathy Psych:  No delerium/psychosis/paranoia  HENT: Normocephalic, Mucus membranes moist.  No thrush Neck: Supple, No tracheal deviation Chest: No pain.  Good respiratory excursion. CV:  Pulses intact.  Regular rhythm MS: Normal AROM mjr joints.  No obvious deformity Abdomen: Soft, Nondistended.  Min tender RLQ 74mm portsite.  No incarcerated hernias.  OnQ caths collapsed Ext:  SCDs BLE.  No significant edema.  No cyanosis Skin: No petechiae / purpura  Discharged Condition: good   Past Medical History  Diagnosis Date  . Neuropathy   . High cholesterol   . Diverticulitis   . Hypertension     not on meds   . Arthritis      Past Surgical History  Procedure Laterality Date  . Tonsillectomy and adenoidectomy    . Gallbladder surgery    . Nasal sinus surgery    . Inguinal hernia repair Right   . Proctoscopy N/A 12/04/2014    Procedure: RIGID PROCTOSCOPY;  Surgeon: Michael Boston, MD;  Location: [facility];  Service: General;  Laterality: N/A;    History   Social History  . Marital Status: Single    Spouse Name: N/A  . Number of Children: 1  . Years of Education: 13   Occupational History  . Materials engineer     [employer]   Social History Main Topics  . Smoking status: Former Smoker -- 0.75 packs/day for 35 years    Types: Cigarettes    Quit date: 11/15/2014  . Smokeless tobacco: Never Used  . Alcohol Use: 0.6 oz/week    1 Cans of beer per week     Comment: Rare  . Drug Use: No  . Sexual Activity: Not on file   Other Topics Concern  . Not on file   Social History Narrative   Patient Lives at home with his girlfriend. Patient works full time for [employer].    Education some college.   Right handed.   Caffeine 3-4 Pots of coffee daily.    Family History  Problem Relation Age of Onset  . Pancreatic cancer Mother   . Heart Problems Mother   . Diabetes Mother   . Diabetes Maternal Grandmother   . Colon cancer Neg Hx   . Colon polyps Neg Hx   . Kidney disease Neg Hx   . Esophageal cancer Neg Hx     Current Facility-Administered Medications  Medication Dose Route Frequency Provider Last Rate Last Dose  . 0.9 %  sodium chloride infusion  250 mL Intravenous PRN Michael Boston, MD      . acetaminophen (TYLENOL) tablet 1,000 mg  1,000 mg Oral TID Michael Boston, MD   1,000 mg at 12/06/14 2329  . alum & mag hydroxide-simeth (MAALOX/MYLANTA) 200-200-20 MG/5ML suspension 30 mL  30 mL Oral Q6H PRN Michael Boston, MD   30 mL at 12/05/14 1651  . alvimopan (ENTEREG) capsule 12 mg  12 mg Oral BID Michael Boston, MD   12 mg at 12/06/14 2159  . aspirin chewable tablet 81 mg  81 mg Oral Daily Michael Boston, MD   81 mg at  12/06/14 0912  . diphenhydrAMINE (BENADRYL) 12.5 MG/5ML elixir 12.5 mg  12.5 mg Oral Q6H PRN Michael Boston, MD       Or  . diphenhydrAMINE (BENADRYL) injection 12.5 mg  12.5 mg Intravenous Q6H PRN Michael Boston, MD      . heparin injection 5,000 Units  5,000 Units Subcutaneous 3 times per day Michael Boston, MD   5,000 Units at 12/07/14 [phone]  . HYDROmorphone (  DILAUDID) injection 0.5-2 mg  0.5-2 mg Intravenous Q1H PRN Michael Boston, MD   2 mg at 12/06/14 0545  . lactated ringers infusion   Intravenous Continuous Kristopher Key, CRNA 10 mL/hr at 12/05/14 1254    . lip balm (CARMEX) ointment 1 application  1 application Topical BID Michael Boston, MD   1 application at 34/28/76 0915  . loratadine (CLARITIN) tablet 10 mg  10 mg Oral Daily PRN Michael Boston, MD      . magic mouthwash  15 mL Oral QID PRN Michael Boston, MD      . menthol-cetylpyridinium (CEPACOL) lozenge 3 mg  1 lozenge Oral PRN Michael Boston, MD      . metoprolol (LOPRESSOR) injection 5 mg  5 mg Intravenous 4 times per day Michael Boston, MD   5 mg at 12/07/14 8115  . naproxen (NAPROSYN) tablet 500 mg  500 mg Oral BID WC Michael Boston, MD   500 mg at 12/06/14 1755  . ondansetron (ZOFRAN) tablet 4 mg  4 mg Oral Q6H PRN Michael Boston, MD       Or  . ondansetron [facility]) injection 4 mg  4 mg Intravenous Q6H PRN Michael Boston, MD      . phenol [facility]) mouth spray 2 spray  2 spray Mouth/Throat PRN Michael Boston, MD      . saccharomyces boulardii (FLORASTOR) capsule 250 mg  250 mg Oral BID Michael Boston, MD   250 mg at 12/06/14 2159  . sodium chloride 0.9 % injection 3 mL  3 mL Intravenous Q12H Michael Boston, MD   3 mL at 12/05/14 2228  . sodium chloride 0.9 % injection 3 mL  3 mL Intravenous PRN Michael Boston, MD      . zolpidem (AMBIEN) tablet 10 mg  10 mg Oral QHS PRN Excell Seltzer, MD   10 mg at 12/05/14 2228     Allergies  Allergen Reactions  . Sudafed [Pseudoephedrine Hcl] Other (See Comments)    "can not sleep"    Disposition: Final  discharge disposition not confirmed  Discharge Instructions    Call MD for:  extreme fatigue    Complete by:  As directed      Call MD for:  extreme fatigue    Complete by:  As directed      Call MD for:  hives    Complete by:  As directed      Call MD for:  hives    Complete by:  As directed      Call MD for:  persistant nausea and vomiting    Complete by:  As directed      Call MD for:  persistant nausea and vomiting    Complete by:  As directed      Call MD for:  redness, tenderness, or signs of infection (pain, swelling, redness, odor or green/yellow discharge around incision site)    Complete by:  As directed      Call MD for:  redness, tenderness, or signs of infection (pain, swelling, redness, odor or green/yellow discharge around incision site)    Complete by:  As directed      Call MD for:  severe uncontrolled pain    Complete by:  As directed      Call MD for:  severe uncontrolled pain    Complete by:  As directed      Call MD for:    Complete by:  As directed   Temperature > 101.37F  Call MD for:    Complete by:  As directed   Temperature > 101.10F     Diet - low sodium heart healthy    Complete by:  As directed      Diet - low sodium heart healthy    Complete by:  As directed      Discharge instructions    Complete by:  As directed   Please see discharge instruction sheets.  Also refer to handout given an office.  Please call our office if you have any questions or concerns [phone]     Discharge instructions    Complete by:  As directed   Please see discharge instruction sheets.  Also refer to handout given an office.  Please call our office if you have any questions or concerns [phone]     Discharge wound care:    Complete by:  As directed   If you have closed incisions, shower and bathe over these incisions with soap and water every day.  Remove all surgical dressings on postoperative day #3.  You do not need to replace dressings over the closed  incisions unless you feel more comfortable with a Band-Aid covering it.   If you have an open wound that requires packing, please see wound care instructions.  In general, remove all dressings, wash wound with soap and water and then replace with saline moistened gauze.  Do the dressing change at least every day.  Please call our office [phone] if you have further questions.     Discharge wound care:    Complete by:  As directed   If you have closed incisions, shower and bathe over these incisions with soap and water every day.  Remove all surgical dressings on postoperative day #3.  You do not need to replace dressings over the closed incisions unless you feel more comfortable with a Band-Aid covering it.   If you have an open wound that requires packing, please see wound care instructions.  In general, remove all dressings, wash wound with soap and water and then replace with saline moistened gauze.  Do the dressing change at least every day.  Please call our office [phone] if you have further questions.     Driving Restrictions    Complete by:  As directed   No driving until off narcotics and can safely swerve away without pain during an emergency     Driving Restrictions    Complete by:  As directed   No driving until off narcotics and can safely swerve away without pain during an emergency     Increase activity slowly    Complete by:  As directed   Walk an hour a day.  Use 20-30 minute walks.  When you can walk 30 minutes without difficulty, increase to low impact/moderate activities such as biking, jogging, swimming, sexual activity..  Eventually can increase to unrestricted activity when not feeling pain.  If you feel pain: STOP!Marland Kitchen   Let pain protect you from overdoing it.  Use ice/heat/over-the-counter pain medications to help minimize his soreness.  Use pain prescriptions as needed to remain active.  It is better to take extra pain medications and be more active than to stay  bedridden to avoid all pain medications.     Increase activity slowly    Complete by:  As directed   Walk an hour a day.  Use 20-30 minute walks.  When you can walk 30 minutes without difficulty, increase to low impact/moderate activities  such as biking, jogging, swimming, sexual activity..  Eventually can increase to unrestricted activity when not feeling pain.  If you feel pain: STOP!Marland Kitchen   Let pain protect you from overdoing it.  Use ice/heat/over-the-counter pain medications to help minimize his soreness.  Use pain prescriptions as needed to remain active.  It is better to take extra pain medications and be more active than to stay bedridden to avoid all pain medications.     Lifting restrictions    Complete by:  As directed   Avoid heavy lifting initially.  Do not push through pain.  You have no specific weight limit.  Coughing and sneezing or four more stressful to your incision than any lifting you will do. Pain will protect you from injury.  Therefore, avoid intense activity until off all narcotic pain medications.  Coughing and sneezing or four more stressful to your incision than any lifting he will do.     Lifting restrictions    Complete by:  As directed   Avoid heavy lifting initially.  Do not push through pain.  You have no specific weight limit.  Coughing and sneezing or four more stressful to your incision than any lifting you will do. Pain will protect you from injury.  Therefore, avoid intense activity until off all narcotic pain medications.  Coughing and sneezing or four more stressful to your incision than any lifting he will do.     May shower / Bathe    Complete by:  As directed      May shower / Bathe    Complete by:  As directed      May walk up steps    Complete by:  As directed      May walk up steps    Complete by:  As directed      Sexual Activity Restrictions    Complete by:  As directed   Sexual activity as tolerated.  Do not push through pain.  Pain will protect you  from injury.     Sexual Activity Restrictions    Complete by:  As directed   Sexual activity as tolerated.  Do not push through pain.  Pain will protect you from injury.     Walk with assistance    Complete by:  As directed   Walk over an hour a day.  May use a walker/cane/companion to help with balance and stamina.     Walk with assistance    Complete by:  As directed   Walk over an hour a day.  May use a walker/cane/companion to help with balance and stamina.            Medication List    TAKE these medications        aspirin 81 MG tablet  Take 81 mg by mouth daily.     loratadine 10 MG tablet  Commonly known as:  CLARITIN  Take 10 mg by mouth daily. As needed     oxyCODONE 5 MG immediate release tablet  Commonly known as:  Oxy IR/ROXICODONE  Take 1-2 tablets (5-10 mg total) by mouth every 4 (four) hours as needed for moderate pain, severe pain or breakthrough pain.           Follow-up Information    Follow up with Alora Gorey C., MD. Schedule an appointment as soon as possible for a visit in 2 weeks.   Specialty:  General Surgery   Why:  To follow up after your operation, To follow up  after your hospital stay   Contact information:   [address] [address] [phone]        Signed: Morton Peters, M.D., F.A.C.S. Gastrointestinal and Minimally Invasive Surgery [facility] [address], [place], [place] [address] [phone] Main / Paging   12/07/2014, 8:06 AM

## 2014-12-07 NOTE — Discharge Summary (Signed)
Reviewed d/c instructions with pt and spouse including medications, incision care, and precautions.  Pt/spouse verbalized understanding of all instructions.  Pt being d/c into care of spouse.

## 2015-01-22 IMAGING — CT CT ABD-PELV W/ CM
2 of 8 series · 14 of 46 positions shown, 18 images · IV contrast (Omnipaque 300)
Comparison: None.

CLINICAL DATA: Subsequent encounter for for left lower quadrant
pain of 2 weeks duration.

EXAM:
CT ABDOMEN AND PELVIS WITH CONTRAST
TECHNIQUE: Multidetector CT imaging of the abdomen and pelvis was performed
using the standard protocol following bolus administration of
intravenous contrast.
CONTRAST:  100mL OMNIPAQUE IOHEXOL 300 MG/ML  SOLN

[Series 2: abd/ pel 5mm · axial · 0.80mm/px · z∈[-428,+2]mm · 11 of 98 slices shown, 15 images]
[im 6/98  soft-tissue]
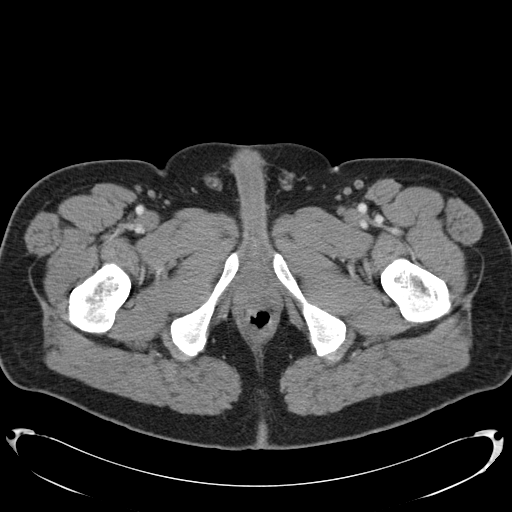
[im 6/98  bone]
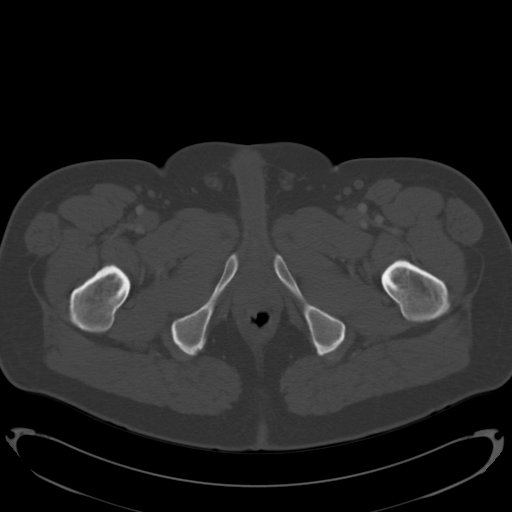
[im 17/98  soft-tissue]
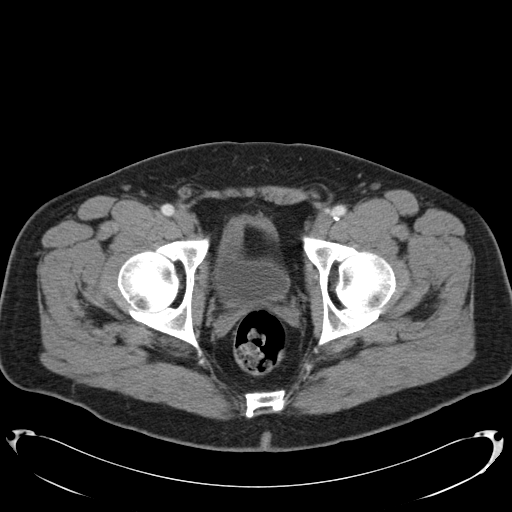
[im 27/98  soft-tissue]
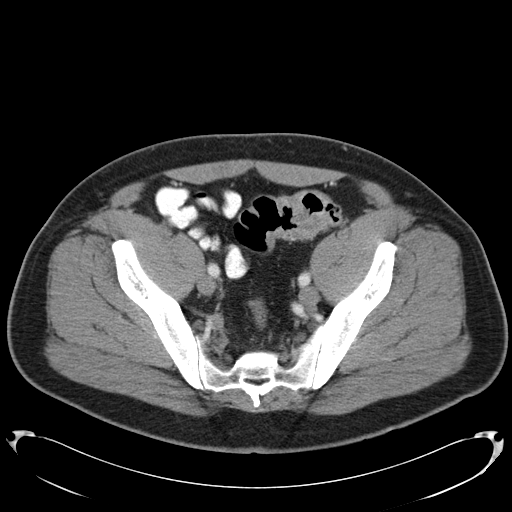
[im 38/98  soft-tissue]
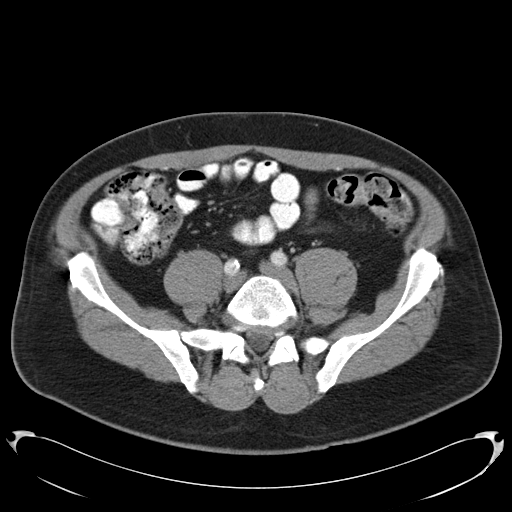
[im 49/98  soft-tissue]
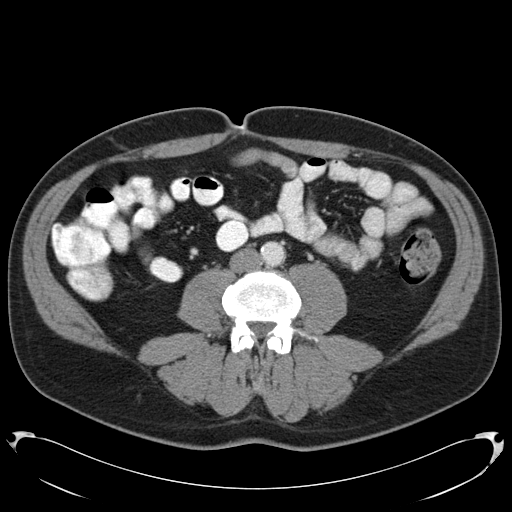
[im 60/98  soft-tissue]
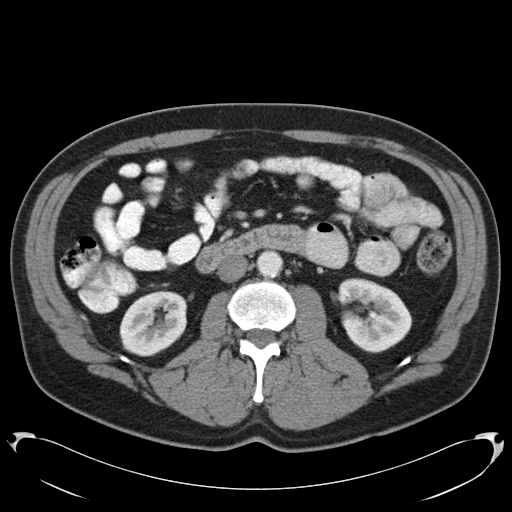
[im 71/98  soft-tissue]
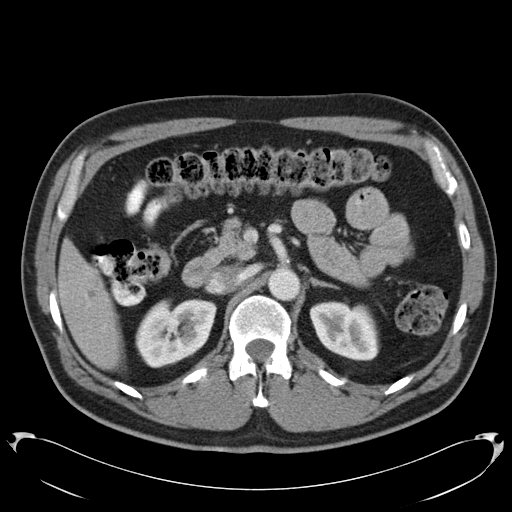
[im 76/98  lung]
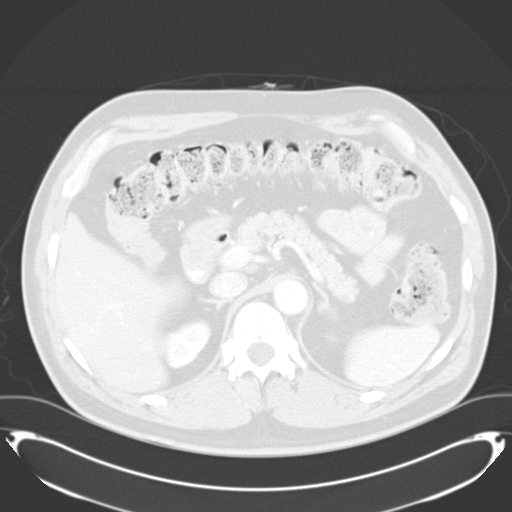
[im 81/98  soft-tissue]
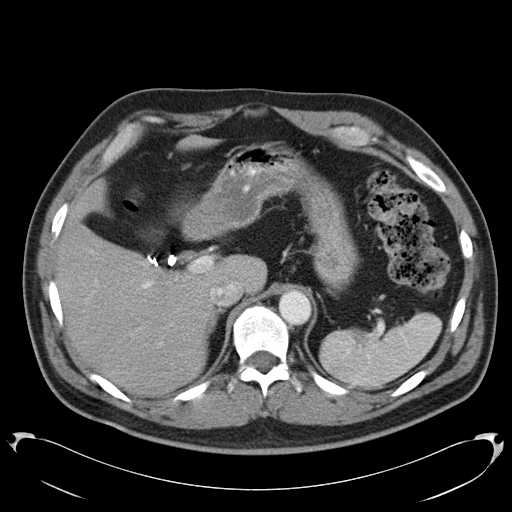
[im 81/98  lung]
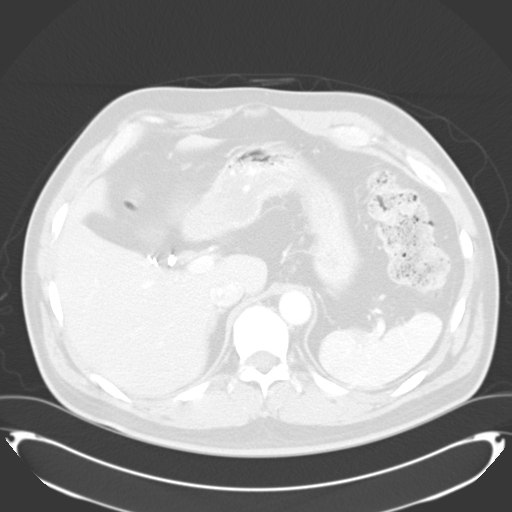
[im 87/98  lung]
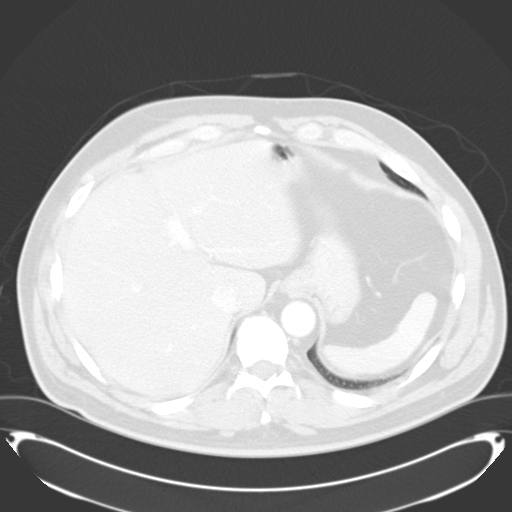
[im 92/98  soft-tissue]
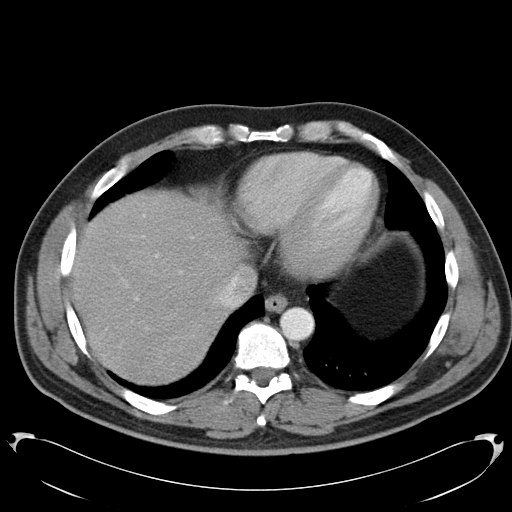
[im 92/98  lung]
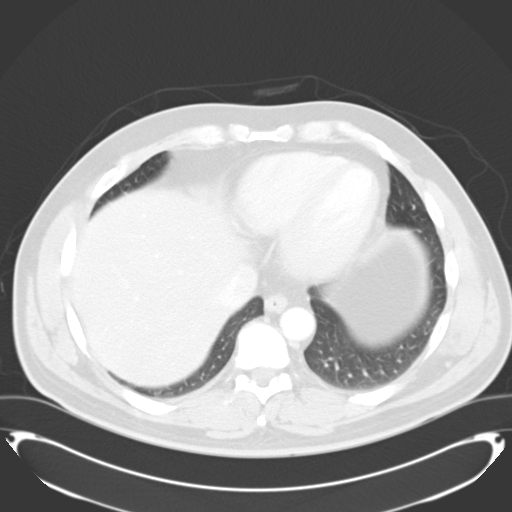
[im 92/98  bone]
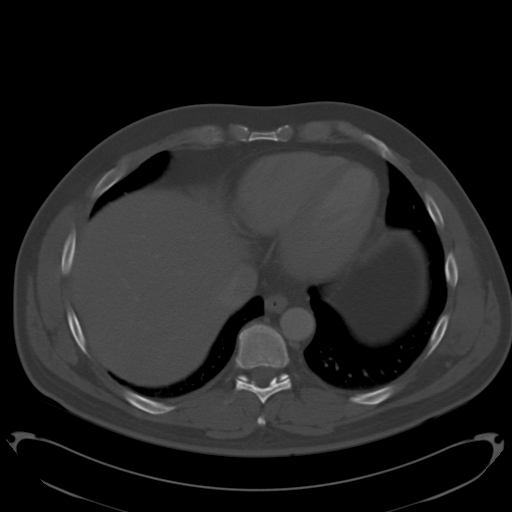

[Series 602: cor1 · coronal · 0.98mm/px · 3 of 122 slices shown]
[im 31/122  soft-tissue]
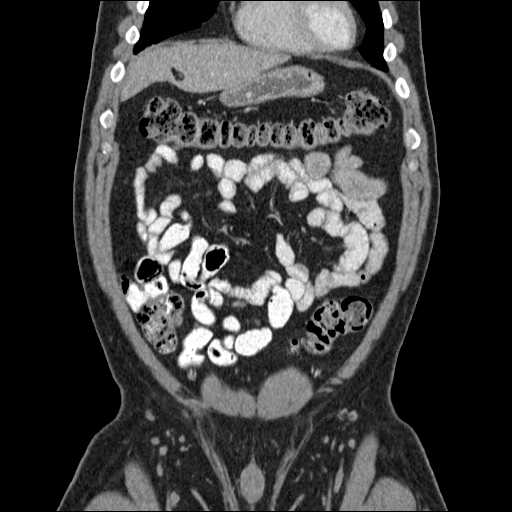
[im 61/122  soft-tissue]
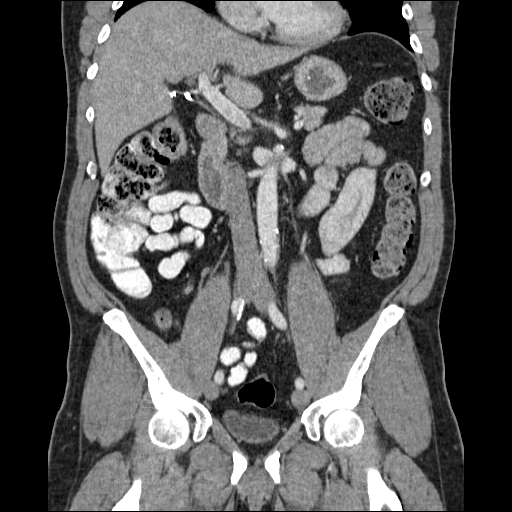
[im 91/122  soft-tissue]
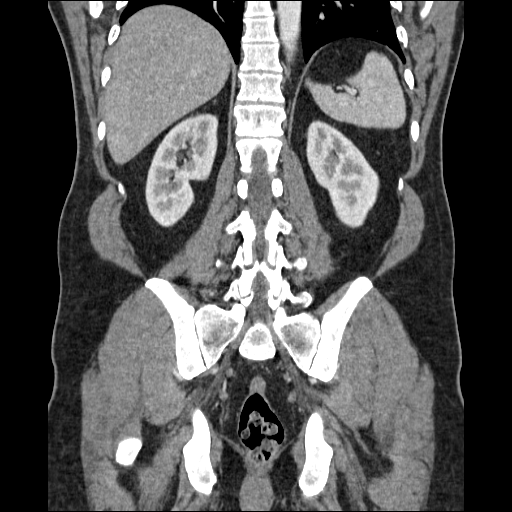

[14 of 46 positions shown; findings below may reference images not displayed]

FINDINGS: Lower chest:  Unremarkable.

Hepatobiliary: The tiny low-density focus in the inferior tip of the
liver is likely a cyst. Gallbladder is surgically absent. No
intrahepatic or extrahepatic biliary dilation.

Pancreas: No focal mass lesion. No dilatation of the main duct. No
intraparenchymal cyst. No peripancreatic edema.

Spleen: No splenomegaly. No focal mass lesion.

Adrenals/Urinary Tract: No adrenal nodule or mass. Insert normal
kidneys No evidence for hydroureter bladder is nondistended but
normal in appearance.

Stomach/Bowel: Stomach is nondistended. No gastric wall thickening.
No evidence of outlet obstruction. Duodenum is normally positioned
as is the ligament of Treitz. No small bowel wall thickening. No
small bowel dilatation. Terminal ileum is normal. The appendix is
normal. Diverticular changes are noted in the left colon. In the
proximal sigmoid segment, there is an area of eccentric wall
thickening with multiple small lymph nodes in the adjacent
mesocolon. There is a small focus of irregular soft tissue
attenuation along the external colonic wall with this level (see
image 60 series 2), but there is no frank pericolonic edema or
inflammation. Left colonic diverticulosis is associated.

Vascular/Lymphatic: Atherosclerotic calcification is noted in the
wall of the abdominal aorta without aneurysm. No abdominal
lymphadenopathy. No pelvic sidewall lymphadenopathy. Increased
number of small lymph nodes in the sigmoid mesocolon as described
above.

Reproductive: Prostate gland is mildly enlarged.

Other: No intraperitoneal free fluid. Small left inguinal hernia
contains only fat.

Musculoskeletal: Bone windows reveal no worrisome lytic or sclerotic
osseous lesions.
IMPRESSION: There is an area above eccentric wall thickening and irregularity in
the proximal sigmoid colon with adjacent small lymph nodes in the
sigmoid mesocolon. This may be related to diverticulitis, but there
is less edema/inflammation in the pericolonic fat than is typically
seen. Colonic neoplasm could also have this appearance.

These results will be called to the ordering clinician or
representative by the Radiologist Assistant, and communication
documented in the PACS or zVision Dashboard.

## 2015-06-04 ENCOUNTER — Ambulatory Visit (INDEPENDENT_AMBULATORY_CARE_PROVIDER_SITE_OTHER): Payer: BLUE CROSS/BLUE SHIELD | Admitting: Podiatry

## 2015-06-04 ENCOUNTER — Encounter: Payer: Self-pay | Admitting: Podiatry

## 2015-06-04 ENCOUNTER — Ambulatory Visit (INDEPENDENT_AMBULATORY_CARE_PROVIDER_SITE_OTHER): Payer: BLUE CROSS/BLUE SHIELD

## 2015-06-04 VITALS — BP 136/72 | HR 89 | Resp 12

## 2015-06-04 DIAGNOSIS — M7751 Other enthesopathy of right foot: Secondary | ICD-10-CM

## 2015-06-04 DIAGNOSIS — M722 Plantar fascial fibromatosis: Secondary | ICD-10-CM

## 2015-06-04 DIAGNOSIS — M779 Enthesopathy, unspecified: Secondary | ICD-10-CM

## 2015-06-04 DIAGNOSIS — M19071 Primary osteoarthritis, right ankle and foot: Secondary | ICD-10-CM

## 2015-06-04 DIAGNOSIS — M778 Other enthesopathies, not elsewhere classified: Secondary | ICD-10-CM

## 2015-06-04 MED ORDER — MELOXICAM 15 MG PO TABS: 15.0000 mg | ORAL_TABLET | Freq: Every day | ORAL | Status: DC

## 2015-06-04 NOTE — Progress Notes (Signed)
   Subjective:    Patient ID: Jack Sims, male    DOB: 1960-12-21, 54 y.o.   MRN: [id number]  HPI: He presents today with a chief complaint of a painful right foot. States that the foot is been hurting for months and is worse in the morning after he isn't sitting and sleeping. He's been taking aspirin for the pain.    Review of Systems  Musculoskeletal: Positive for gait problem.       Objective:   Physical Exam: 54 year old male no acute distress vital signs stable alert and oriented 3. Pulses are strongly palpable bilateral. Neurologic sensorium is intact per Semmes-Weinstein monofilament. Deep tendon reflexes are intact bilateral and muscle strength +5 over 5 dorsiflexion plantar flexors and inverters everters onto the musculature is intact. Orthopedic evaluation demonstrates all joints distal to the ankle, full range of motion without crepitus. Pain on palpation medial calcaneal tubercle. Radiographs demonstrate significant osteoarthritic changes of the midfoot also soft tissue increase in density of the plantar fascial calcaneal insertion site of the right heel.      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  Plan: Started him on meloxicam injected the left heel plantar fascial brace and a plantar fascial night splint dispensed discussed progress shoe gear stretching exercises and ice therapy follow-up with him in 1 month.

## 2015-07-07 ENCOUNTER — Ambulatory Visit: Payer: BLUE CROSS/BLUE SHIELD | Admitting: Podiatry

## 2015-09-10 ENCOUNTER — Telehealth: Payer: Self-pay | Admitting: *Deleted

## 2015-09-10 MED ORDER — MELOXICAM 15 MG PO TABS: 15.0000 mg | ORAL_TABLET | Freq: Every day | ORAL | Status: DC

## 2015-09-10 NOTE — Telephone Encounter (Signed)
Faxed refill request for Meloxicam 15mg  #90 +2 refills, okayed by Dr. Al CorpusHyatt.

## 2016-09-20 ENCOUNTER — Other Ambulatory Visit: Payer: Self-pay | Admitting: Podiatry

## 2016-09-20 NOTE — Telephone Encounter (Signed)
Pt needs an appt prior to future refills. 

## 2016-10-20 ENCOUNTER — Telehealth: Payer: Self-pay | Admitting: *Deleted

## 2016-10-20 NOTE — Telephone Encounter (Signed)
Received request for 90 days or 1 year of Meloxicam. Dr. Al CorpusHyatt states pt not seen in office since 05/2015 and needs an appt. Fax returned denied.
